# Patient Record
Sex: Male | Born: 1965
Health system: Southern US, Community
[De-identification: ages and names within clinical notes are randomized; demographics above are authoritative.]

## PROBLEM LIST (undated history)

## (undated) DIAGNOSIS — G459 Transient cerebral ischemic attack, unspecified: Secondary | ICD-10-CM

## (undated) DIAGNOSIS — E7800 Pure hypercholesterolemia, unspecified: Secondary | ICD-10-CM

## (undated) DIAGNOSIS — I639 Cerebral infarction, unspecified: Secondary | ICD-10-CM

## (undated) DIAGNOSIS — M539 Dorsopathy, unspecified: Secondary | ICD-10-CM

## (undated) HISTORY — DX: Transient cerebral ischemic attack, unspecified: G45.9

## (undated) HISTORY — DX: Cerebral infarction, unspecified: I63.9

## (undated) HISTORY — DX: Dorsopathy, unspecified: M53.9

## (undated) HISTORY — PX: FINGER SURGERY: SHX640

---

## 2007-12-28 ENCOUNTER — Emergency Department (HOSPITAL_COMMUNITY): Admission: EM | Admit: 2007-12-28 | Discharge: 2007-12-28 | Payer: Self-pay | Admitting: Emergency Medicine

## 2019-02-14 DIAGNOSIS — R109 Unspecified abdominal pain: Secondary | ICD-10-CM | POA: Diagnosis not present

## 2019-02-14 DIAGNOSIS — M5441 Lumbago with sciatica, right side: Secondary | ICD-10-CM | POA: Diagnosis not present

## 2019-02-14 DIAGNOSIS — M62830 Muscle spasm of back: Secondary | ICD-10-CM | POA: Diagnosis not present

## 2019-03-04 DIAGNOSIS — M5441 Lumbago with sciatica, right side: Secondary | ICD-10-CM | POA: Diagnosis not present

## 2019-03-04 DIAGNOSIS — M4726 Other spondylosis with radiculopathy, lumbar region: Secondary | ICD-10-CM | POA: Diagnosis not present

## 2019-03-04 DIAGNOSIS — M5442 Lumbago with sciatica, left side: Secondary | ICD-10-CM | POA: Diagnosis not present

## 2019-03-09 DIAGNOSIS — M5416 Radiculopathy, lumbar region: Secondary | ICD-10-CM | POA: Diagnosis not present

## 2019-03-29 DIAGNOSIS — M5416 Radiculopathy, lumbar region: Secondary | ICD-10-CM | POA: Diagnosis not present

## 2019-03-29 DIAGNOSIS — M5126 Other intervertebral disc displacement, lumbar region: Secondary | ICD-10-CM | POA: Diagnosis not present

## 2019-03-29 DIAGNOSIS — M48061 Spinal stenosis, lumbar region without neurogenic claudication: Secondary | ICD-10-CM | POA: Diagnosis not present

## 2019-06-06 DIAGNOSIS — Z20828 Contact with and (suspected) exposure to other viral communicable diseases: Secondary | ICD-10-CM | POA: Diagnosis not present

## 2019-06-06 DIAGNOSIS — Z03818 Encounter for observation for suspected exposure to other biological agents ruled out: Secondary | ICD-10-CM | POA: Diagnosis not present

## 2019-06-06 DIAGNOSIS — U071 COVID-19: Secondary | ICD-10-CM | POA: Diagnosis not present

## 2019-11-11 ENCOUNTER — Ambulatory Visit: Payer: Self-pay | Admitting: Emergency Medicine

## 2020-01-12 ENCOUNTER — Encounter: Payer: Self-pay | Admitting: Emergency Medicine

## 2020-01-12 ENCOUNTER — Other Ambulatory Visit: Payer: Self-pay

## 2020-01-12 ENCOUNTER — Ambulatory Visit (INDEPENDENT_AMBULATORY_CARE_PROVIDER_SITE_OTHER): Payer: BC Managed Care – PPO | Admitting: Emergency Medicine

## 2020-01-12 VITALS — BP 166/82 | HR 72 | Temp 98.3°F | Resp 16 | Ht 68.75 in | Wt 151.0 lb

## 2020-01-12 DIAGNOSIS — Z7689 Persons encountering health services in other specified circumstances: Secondary | ICD-10-CM

## 2020-01-12 DIAGNOSIS — G459 Transient cerebral ischemic attack, unspecified: Secondary | ICD-10-CM | POA: Diagnosis not present

## 2020-01-12 NOTE — Patient Instructions (Addendum)
Start taking Bayer baby aspirin daily.    If you have lab work done today you will be contacted with your lab results within the next 2 weeks.  If you have not heard from [place] then please contact us. The fastest way to get your results is to register for My Chart.   IF you received an x-ray today, you will receive an invoice from [facility]. Please contact [facility] at [phone] with questions or concerns regarding your invoice.   IF you received labwork today, you will receive an invoice from [facility]. Please contact [facility] at [phone] with questions or concerns regarding your invoice.   Our billing staff will not be able to assist you with questions regarding bills from these companies.  You will be contacted with the lab results as soon as they are available. The fastest way to get your results is to activate your My Chart account. Instructions are located on the last page of this paperwork. If you have not heard from [place] regarding the results in 2 weeks, please contact this office.     Transient Ischemic Attack  A transient ischemic attack (TIA) is a "warning stroke" that causes stroke-like symptoms that go away quickly. A TIA does not cause lasting damage to the brain. But having a TIA is a sign that you may be at risk for a stroke. Lifestyle changes and medical treatments can help prevent a stroke. It is important to know the symptoms of a TIA and what to do. Get help right away, even if your symptoms go away. The symptoms of a TIA are the same as those of a stroke. They can happen fast, and they usually go away within minutes or hours. They can include:  Weakness or loss of feeling in your face, arm, or leg. This often happens on one side of your body.  Trouble walking.  Trouble moving your arms or legs.  Trouble talking or understanding what people are saying.  Trouble seeing.  Seeing two of one object (double vision).  Feeling dizzy.  Feeling  confused.  Loss of balance or coordination.  Feeling sick to your stomach (nauseous) and throwing up (vomiting).  A very bad headache for no reason. What increases the risk? Certain things may make you more likely to have a TIA. Some of these are things that you can change, such as:  Being very overweight (obese).  Using products that contain nicotine or tobacco, such as cigarettes and e-cigarettes.  Taking birth control pills.  Not being active.  Drinking too much alcohol.  Using drugs. Other risk factors include:  Having an irregular heartbeat (atrial fibrillation).  Being African American or Hispanic.  Having had blood clots, stroke, TIA, or heart attack in the past.  Being a woman with a history of high blood pressure in pregnancy (preeclampsia).  Being over the age of 56.  Being male.  Having family history of stroke.  Having the following diseases or conditions: ? High blood pressure. ? High cholesterol. ? Diabetes. ? Heart disease. ? Sickle cell disease. ? Sleep apnea. ? Migraine headache. ? Long-term (chronic) diseases that cause soreness and swelling (inflammation). ? Disorders that affect how your blood clots. Follow these instructions at home: Medicines   Take over-the-counter and prescription medicines only as told by your doctor.  If you were told to take aspirin or another medicine to thin your blood, take it exactly as told by your doctor. ? Taking too much of the medicine can cause  bleeding. ? Taking too little of the medicine may not work to treat the problem. Eating and drinking   Eat 5 or more servings of fruits and vegetables each day.  Follow instructions from your doctor about your diet. You may need to follow a certain diet to help lower your risk of having a stroke. You may need to: ? Eat a diet that is low in fat and salt. ? Eat foods that contain a lot of fiber. ? Limit the amount of carbohydrates and sugar in your  diet.  Limit alcohol intake to 1 drink a day for nonpregnant women and 2 drinks a day for men. One drink equals 12 oz of beer, 5 oz of wine, or 1 oz of hard liquor. General instructions  Keep a healthy weight.  Stay active. Try to get at least 30 minutes of activity on all or most days.  Find out if you have a condition called sleep apnea. Get treatment if needed.  Do not use any products that contain nicotine or tobacco, such as cigarettes and e-cigarettes. If you need help quitting, ask your doctor.  Do not abuse drugs.  Keep all follow-up visits as told by your doctor. This is important. Get help right away if:  You have any signs of stroke. "BE FAST" is an easy way to remember the main warning signs: ? B - Balance. Signs are dizziness, sudden trouble walking, or loss of balance. ? E - Eyes. Signs are trouble seeing or a sudden change in how you see. ? F - Face. Signs are sudden weakness or loss of feeling of the face, or the face or eyelid drooping on one side. ? A - Arms. Signs are weakness or loss of feeling in an arm. This happens suddenly and usually on one side of the body. ? S - Speech. Signs are sudden trouble speaking, slurred speech, or trouble understanding what people say. ? T - Time. Time to call emergency services. Write down what time symptoms started.  You have other signs of stroke, such as: ? A sudden, very bad headache with no known cause. ? Feeling sick to your stomach (nausea). ? Throwing up (vomiting). ? Jerky movements that you cannot control (seizure). These symptoms may be an emergency. Do not wait to see if the symptoms will go away. Get medical help right away. Call your local emergency services (911 in the [place] Do not drive yourself to the hospital. Summary  A transient ischemic attack (TIA) is a "warning stroke" that causes stroke-like symptoms that go away quickly.  A TIA is a medical emergency. Get help right away, even if your symptoms go  away.  A TIA does not cause lasting damage to the brain.  Having a TIA is a sign that you may be at risk for a stroke. Lifestyle changes and medical treatments can help prevent a stroke. This information is not intended to replace advice given to you by your health care provider. Make sure you discuss any questions you have with your health care provider. Document Revised: 11/28/2017 Document Reviewed: 06/05/2016 Elsevier Patient Education  2020 [employer].

## 2020-01-12 NOTE — Progress Notes (Signed)
Robert Johnson 54 y.o.   Chief Complaint  Patient presents with  . Establish Care    per patient wants to check it hypertension, cancer, alzhemier's    HISTORY OF PRESENT ILLNESS: This is a 54 y.o. male here with daughter to establish care with me. Healthy male with a healthy lifestyle and no chronic medical problems on no chronic medications. Yesterday he had an episode that lasted about 5 minutes were his right arm and right leg became numb and weak and was also having trouble getting his words out.  Witnessed by wife at home.  No similar prior episodes.  Asymptomatic today.  HPI   Prior to Admission medications   Not on File    No Known Allergies  There are no problems to display for this patient.   History reviewed. No pertinent past medical history.  History reviewed. No pertinent surgical history.  Social History   Socioeconomic History  . Marital status: Married    Spouse name: Not on file  . Number of children: Not on file  . Years of education: Not on file  . Highest education level: Not on file  Occupational History  . Not on file  Tobacco Use  . Smoking status: Former Smoker    Years: 17.00    Types: Cigarettes  . Smokeless tobacco: Never Used  Substance and Sexual Activity  . Alcohol use: Yes    Comment: 1-2 beers once in a while  . Drug use: Never  . Sexual activity: Not on file  Other Topics Concern  . Not on file  Social History Narrative  . Not on file   Social Determinants of Health   Financial Resource Strain:   . Difficulty of Paying Living Expenses: Not on file  Food Insecurity:   . Worried About Programme researcher, broadcasting/film/video in the Last Year: Not on file  . Ran Out of Food in the Last Year: Not on file  Transportation Needs:   . Lack of Transportation (Medical): Not on file  . Lack of Transportation (Non-Medical): Not on file  Physical Activity:   . Days of Exercise per Week: Not on file  . Minutes of Exercise per Session: Not on file  Stress:    . Feeling of Stress : Not on file  Social Connections:   . Frequency of Communication with Friends and Family: Not on file  . Frequency of Social Gatherings with Friends and Family: Not on file  . Attends Religious Services: Not on file  . Active Member of Clubs or Organizations: Not on file  . Attends Banker Meetings: Not on file  . Marital Status: Not on file  Intimate Partner Violence:   . Fear of Current or Ex-Partner: Not on file  . Emotionally Abused: Not on file  . Physically Abused: Not on file  . Sexually Abused: Not on file    History reviewed. No pertinent family history.   Review of Systems  Constitutional: Negative.  Negative for chills and fever.  HENT: Negative.  Negative for congestion and sore throat.   Respiratory: Negative.  Negative for cough and shortness of breath.   Cardiovascular: Negative.  Negative for chest pain and palpitations.  Gastrointestinal: Negative for abdominal pain, diarrhea, nausea and vomiting.  Musculoskeletal: Negative.   Skin: Negative.  Negative for rash.  Neurological: Positive for dizziness and headaches. Negative for sensory change, speech change, seizures and loss of consciousness.  All other systems reviewed and are negative.  Physical Exam Vitals reviewed.  Constitutional:      Appearance: Normal appearance.  HENT:     Head: Normocephalic.     Mouth/Throat:     Mouth: Mucous membranes are moist.     Pharynx: Oropharynx is clear.  Eyes:     Extraocular Movements: Extraocular movements intact.     Conjunctiva/sclera: Conjunctivae normal.     Pupils: Pupils are equal, round, and reactive to light.  Cardiovascular:     Rate and Rhythm: Normal rate and regular rhythm.     Pulses: Normal pulses.     Heart sounds: Normal heart sounds.  Pulmonary:     Effort: Pulmonary effort is normal.     Breath sounds: Normal breath sounds.  Abdominal:     General: Bowel sounds are normal. There is no distension.      Palpations: Abdomen is soft.     Tenderness: There is no abdominal tenderness.  Musculoskeletal:        General: Normal range of motion.     Cervical back: Normal range of motion and neck supple.  Skin:    General: Skin is warm and dry.     Capillary Refill: Capillary refill takes less than 2 seconds.  Neurological:     General: No focal deficit present.     Mental Status: He is alert and oriented to person, place, and time.  Psychiatric:        Mood and Affect: Mood normal.        Behavior: Behavior normal.    A total of 45 minutes was spent with the patient, greater than 50% of which was in counseling/coordination of care regarding differential diagnosis of his symptoms including possibility of TIA, ED precautions, need to start taking baby aspirin daily, need for urgent referral to neurologist, prognosis, health maintenance items, documentation and need for follow-up after neurology evaluation.   ASSESSMENT & PLAN: Robert Johnson was seen today for establish care.  Diagnoses and all orders for this visit:  Transient ischemic attack (TIA) -     Comprehensive metabolic panel -     Lipid panel -     Hemoglobin A1c -     Ambulatory referral to Neurology -     Vitamin B12 -     CBC with Differential/Platelet  Encounter to establish care    Patient Instructions   Start taking Bayer baby aspirin daily.    If you have lab work done today you will be contacted with your lab results within the next 2 weeks.  If you have not heard from us then please contact us. The fastest way to get your results is to register for My Chart.   IF you received an x-ray today, you will receive an invoice from [facility]. Please contact [facility] at [phone] with questions or concerns regarding your invoice.   IF you received labwork today, you will receive an invoice from [facility]. Please contact [facility] at [phone] with questions or concerns regarding your invoice.   Our  billing staff will not be able to assist you with questions regarding bills from these companies.  You will be contacted with the lab results as soon as they are available. The fastest way to get your results is to activate your My Chart account. Instructions are located on the last page of this paperwork. If you have not heard from us regarding the results in 2 weeks, please contact this office.     Transient Ischemic Attack  A transient ischemic  attack (TIA) is a "warning stroke" that causes stroke-like symptoms that go away quickly. A TIA does not cause lasting damage to the brain. But having a TIA is a sign that you may be at risk for a stroke. Lifestyle changes and medical treatments can help prevent a stroke. It is important to know the symptoms of a TIA and what to do. Get help right away, even if your symptoms go away. The symptoms of a TIA are the same as those of a stroke. They can happen fast, and they usually go away within minutes or hours. They can include:  Weakness or loss of feeling in your face, arm, or leg. This often happens on one side of your body.  Trouble walking.  Trouble moving your arms or legs.  Trouble talking or understanding what people are saying.  Trouble seeing.  Seeing two of one object (double vision).  Feeling dizzy.  Feeling confused.  Loss of balance or coordination.  Feeling sick to your stomach (nauseous) and throwing up (vomiting).  A very bad headache for no reason. What increases the risk? Certain things may make you more likely to have a TIA. Some of these are things that you can change, such as:  Being very overweight (obese).  Using products that contain nicotine or tobacco, such as cigarettes and e-cigarettes.  Taking birth control pills.  Not being active.  Drinking too much alcohol.  Using drugs. Other risk factors include:  Having an irregular heartbeat (atrial fibrillation).  Being African American or  Hispanic.  Having had blood clots, stroke, TIA, or heart attack in the past.  Being a woman with a history of high blood pressure in pregnancy (preeclampsia).  Being over the age of 2.  Being male.  Having family history of stroke.  Having the following diseases or conditions: ? High blood pressure. ? High cholesterol. ? Diabetes. ? Heart disease. ? Sickle cell disease. ? Sleep apnea. ? Migraine headache. ? Long-term (chronic) diseases that cause soreness and swelling (inflammation). ? Disorders that affect how your blood clots. Follow these instructions at home: Medicines   Take over-the-counter and prescription medicines only as told by your doctor.  If you were told to take aspirin or another medicine to thin your blood, take it exactly as told by your doctor. ? Taking too much of the medicine can cause bleeding. ? Taking too little of the medicine may not work to treat the problem. Eating and drinking   Eat 5 or more servings of fruits and vegetables each day.  Follow instructions from your doctor about your diet. You may need to follow a certain diet to help lower your risk of having a stroke. You may need to: ? Eat a diet that is low in fat and salt. ? Eat foods that contain a lot of fiber. ? Limit the amount of carbohydrates and sugar in your diet.  Limit alcohol intake to 1 drink a day for nonpregnant women and 2 drinks a day for men. One drink equals 12 oz of beer, 5 oz of wine, or 1 oz of hard liquor. General instructions  Keep a healthy weight.  Stay active. Try to get at least 30 minutes of activity on all or most days.  Find out if you have a condition called sleep apnea. Get treatment if needed.  Do not use any products that contain nicotine or tobacco, such as cigarettes and e-cigarettes. If you need help quitting, ask your doctor.  Do not abuse  drugs.  Keep all follow-up visits as told by your doctor. This is important. Get help right away  if:  You have any signs of stroke. "BE FAST" is an easy way to remember the main warning signs: ? B - Balance. Signs are dizziness, sudden trouble walking, or loss of balance. ? E - Eyes. Signs are trouble seeing or a sudden change in how you see. ? F - Face. Signs are sudden weakness or loss of feeling of the face, or the face or eyelid drooping on one side. ? A - Arms. Signs are weakness or loss of feeling in an arm. This happens suddenly and usually on one side of the body. ? S - Speech. Signs are sudden trouble speaking, slurred speech, or trouble understanding what people say. ? T - Time. Time to call emergency services. Write down what time symptoms started.  You have other signs of stroke, such as: ? A sudden, very bad headache with no known cause. ? Feeling sick to your stomach (nausea). ? Throwing up (vomiting). ? Jerky movements that you cannot control (seizure). These symptoms may be an emergency. Do not wait to see if the symptoms will go away. Get medical help right away. Call your local emergency services (911 in the [place] Do not drive yourself to the hospital. Summary  A transient ischemic attack (TIA) is a "warning stroke" that causes stroke-like symptoms that go away quickly.  A TIA is a medical emergency. Get help right away, even if your symptoms go away.  A TIA does not cause lasting damage to the brain.  Having a TIA is a sign that you may be at risk for a stroke. Lifestyle changes and medical treatments can help prevent a stroke. This information is not intended to replace advice given to you by your health care provider. Make sure you discuss any questions you have with your health care provider. Document Revised: 11/28/2017 Document Reviewed: 06/05/2016 Elsevier Patient Education  2020 [employer]      Edwina Barth, MD [facility]

## 2020-01-13 ENCOUNTER — Other Ambulatory Visit: Payer: Self-pay | Admitting: Emergency Medicine

## 2020-01-13 DIAGNOSIS — E785 Hyperlipidemia, unspecified: Secondary | ICD-10-CM

## 2020-01-13 LAB — COMPREHENSIVE METABOLIC PANEL
ALT: 22 IU/L (ref 0–44)
AST: 16 IU/L (ref 0–40)
Albumin/Globulin Ratio: 1.6 (ref 1.2–2.2)
Albumin: 4.5 g/dL (ref 3.8–4.9)
Alkaline Phosphatase: 76 IU/L (ref 44–121)
BUN/Creatinine Ratio: 11 (ref 9–20)
BUN: 9 mg/dL (ref 6–24)
Bilirubin Total: 0.6 mg/dL (ref 0.0–1.2)
CO2: 22 mmol/L (ref 20–29)
Calcium: 9.6 mg/dL (ref 8.7–10.2)
Chloride: 108 mmol/L — ABNORMAL HIGH (ref 96–106)
Creatinine, Ser: 0.83 mg/dL (ref 0.76–1.27)
GFR calc Af Amer: 117 mL/min/{1.73_m2} (ref >59)
GFR calc non Af Amer: 101 mL/min/{1.73_m2} (ref >59)
Globulin, Total: 2.8 g/dL (ref 1.5–4.5)
Glucose: 93 mg/dL (ref 65–99)
Potassium: 3.9 mmol/L (ref 3.5–5.2)
Sodium: 145 mmol/L — ABNORMAL HIGH (ref 134–144)
Total Protein: 7.3 g/dL (ref 6.0–8.5)

## 2020-01-13 LAB — CBC WITH DIFFERENTIAL/PLATELET
Basophils Absolute: 0.1 10*3/uL (ref 0.0–0.2)
Basos: 1 %
EOS (ABSOLUTE): 1.1 10*3/uL — ABNORMAL HIGH (ref 0.0–0.4)
Eos: 15 %
Hematocrit: 43.6 % (ref 37.5–51.0)
Hemoglobin: 13.6 g/dL (ref 13.0–17.7)
Immature Grans (Abs): 0 10*3/uL (ref 0.0–0.1)
Immature Granulocytes: 0 %
Lymphocytes Absolute: 1.7 10*3/uL (ref 0.7–3.1)
Lymphs: 24 %
MCH: 23.4 pg — ABNORMAL LOW (ref 26.6–33.0)
MCHC: 31.2 g/dL — ABNORMAL LOW (ref 31.5–35.7)
MCV: 75 fL — ABNORMAL LOW (ref 79–97)
Monocytes Absolute: 0.4 10*3/uL (ref 0.1–0.9)
Monocytes: 6 %
Neutrophils Absolute: 3.9 10*3/uL (ref 1.4–7.0)
Neutrophils: 54 %
Platelets: 244 10*3/uL (ref 150–450)
RBC: 5.82 x10E6/uL — ABNORMAL HIGH (ref 4.14–5.80)
RDW: 14.9 % (ref 11.6–15.4)
WBC: 7.1 10*3/uL (ref 3.4–10.8)

## 2020-01-13 LAB — LIPID PANEL
Chol/HDL Ratio: 5.2 ratio — ABNORMAL HIGH (ref 0.0–5.0)
Cholesterol, Total: 263 mg/dL — ABNORMAL HIGH (ref 100–199)
HDL: 51 mg/dL (ref >39)
LDL Chol Calc (NIH): 170 mg/dL — ABNORMAL HIGH (ref 0–99)
Triglycerides: 224 mg/dL — ABNORMAL HIGH (ref 0–149)
VLDL Cholesterol Cal: 42 mg/dL — ABNORMAL HIGH (ref 5–40)

## 2020-01-13 LAB — HEMOGLOBIN A1C
Est. average glucose Bld gHb Est-mCnc: 85 mg/dL
Hgb A1c MFr Bld: 4.6 % — ABNORMAL LOW (ref 4.8–5.6)

## 2020-01-13 LAB — VITAMIN B12: Vitamin B-12: 452 pg/mL (ref 232–1245)

## 2020-01-13 MED ORDER — ROSUVASTATIN CALCIUM 20 MG PO TABS: 20.0000 mg | ORAL_TABLET | Freq: Every day | ORAL | 3 refills | Status: DC

## 2020-01-17 ENCOUNTER — Telehealth: Payer: Self-pay | Admitting: Diagnostic Neuroimaging

## 2020-01-17 ENCOUNTER — Ambulatory Visit (INDEPENDENT_AMBULATORY_CARE_PROVIDER_SITE_OTHER): Payer: BC Managed Care – PPO | Admitting: Diagnostic Neuroimaging

## 2020-01-17 ENCOUNTER — Other Ambulatory Visit: Payer: Self-pay

## 2020-01-17 ENCOUNTER — Encounter: Payer: Self-pay | Admitting: Diagnostic Neuroimaging

## 2020-01-17 VITALS — BP 122/67 | HR 70 | Ht 67.0 in | Wt 153.4 lb

## 2020-01-17 DIAGNOSIS — G459 Transient cerebral ischemic attack, unspecified: Secondary | ICD-10-CM

## 2020-01-17 NOTE — Patient Instructions (Signed)
  TIA (right arm and leg numbness; aphasia) - MRI brain, carotid u/s, echocardiogram - continue aspirin 81mg , crestor

## 2020-01-17 NOTE — Progress Notes (Signed)
[facility]  PATIENT: Robert Johnson DOB: [birth year]  REFERRING CLINICIAN: Georgina Quint, * HISTORY FROM: patient  REASON FOR VISIT: new consult    HISTORICAL  CHIEF COMPLAINT:  Chief Complaint  Patient presents with   Possible TIA    rm 7 New Pt dgtr- Joni Reining "mom witnessed patient unable to move right side of his body, couldn't speak; still nauseous with constant headache; saw new PCP the very next day"    HISTORY OF PRESENT ILLNESS:    54 year old male here for evaluation of transient right-sided numbness and weakness.  01/11/2020 at 1:00 morning patient had 1 to 2 minutes of right face, right arm, right leg weakness, numbness and difficulty talking.  Patient has been having some headaches and "fevers" for several months leading up to this.  Patient continues to have daily headache and nausea.  Between 6 and 9:00 in the morning he has a hot feeling internally.  No night sweats.  No abnormal temperature readings.   REVIEW OF SYSTEMS: Full 14 system review of systems performed and negative with exception of: As per HPI.  ALLERGIES: No Known Allergies  HOME MEDICATIONS: Outpatient Medications Prior to Visit  Medication Sig Dispense Refill   acetaminophen (TYLENOL) 500 MG tablet Take 500 mg by mouth every 6 (six) hours as needed.     aspirin EC 81 MG tablet Take 81 mg by mouth daily. Swallow whole.     Aspirin-Acetaminophen (GOODYS BODY PAIN PO) Take by mouth.     rosuvastatin (CRESTOR) 20 MG tablet Take 1 tablet (20 mg total) by mouth daily. (Patient not taking: Reported on 01/17/2020) 90 tablet 3   No facility-administered medications prior to visit.    PAST MEDICAL HISTORY: Past Medical History:  Diagnosis Date   Back problem    Mini stroke (HCC)    10/26    PAST SURGICAL HISTORY: Past Surgical History:  Procedure Laterality Date   FINGER SURGERY Left 2007 or 2008   index    FAMILY HISTORY: Family History  Problem Relation Age of  Onset   Mental illness Mother    Hypertension Father     SOCIAL HISTORY: Social History   Socioeconomic History   Marital status: Married    Spouse name: Not on file   Number of children: 4   Years of education: Not on file   Highest education level: Not on file  Occupational History   Not on file  Tobacco Use   Smoking status: Former Smoker    Years: 17.00    Types: Cigarettes    Quit date: 01/17/2004    Years since quitting: 16.0   Smokeless tobacco: Never Used  Substance and Sexual Activity   Alcohol use: Yes    Comment: 1-2 beers once in a while   Drug use: Never   Sexual activity: Not on file  Other Topics Concern   Not on file  Social History Narrative   Lives with wife, family   Social Determinants of Health   Financial Resource Strain:    Difficulty of Paying Living Expenses: Not on file  Food Insecurity:    Worried About Programme researcher, broadcasting/film/video in the Last Year: Not on file   [employer] of Food in the Last Year: Not on file  Transportation Needs:    Lack of Transportation (Medical): Not on file   Lack of Transportation (Non-Medical): Not on file  Physical Activity:    Days of Exercise per Week: Not on file  Minutes of Exercise per Session: Not on file  Stress:    Feeling of Stress : Not on file  Social Connections:    Frequency of Communication with Friends and Family: Not on file   Frequency of Social Gatherings with Friends and Family: Not on file   Attends Religious Services: Not on file   Active Member of Clubs or Organizations: Not on file   Attends Banker Meetings: Not on file   Marital Status: Not on file  Intimate Partner Violence:    Fear of Current or Ex-Partner: Not on file   Emotionally Abused: Not on file   Physically Abused: Not on file   Sexually Abused: Not on file     PHYSICAL EXAM  GENERAL EXAM/CONSTITUTIONAL: Vitals:  Vitals:   01/17/20 0941  BP: 122/67  Pulse: 70  Weight: 153 lb  6.4 oz (69.6 kg)  Height: 5\' 7"  (1.702 m)     Body mass index is 24.03 kg/m. Wt Readings from Last 3 Encounters:  01/17/20 153 lb 6.4 oz (69.6 kg)  01/12/20 151 lb (68.5 kg)     Patient is in no distress; well developed, nourished and groomed; neck is supple  CARDIOVASCULAR:  Examination of carotid arteries is normal; no carotid bruits  Regular rate and rhythm, no murmurs  Examination of peripheral vascular system by observation and palpation is normal  EYES:  Ophthalmoscopic exam of optic discs and posterior segments is normal; no papilledema or hemorrhages  No exam data present  MUSCULOSKELETAL:  Gait, strength, tone, movements noted in Neurologic exam below  NEUROLOGIC: MENTAL STATUS:  No flowsheet data found.  awake, alert, oriented to person, place and time  recent and remote memory intact  normal attention and concentration  language fluent, comprehension intact, naming intact  fund of knowledge appropriate  CRANIAL NERVE:   2nd - no papilledema on fundoscopic exam  2nd, 3rd, 4th, 6th - pupils equal and reactive to light, visual fields full to confrontation, extraocular muscles intact, no nystagmus  5th - facial sensation symmetric  7th - facial strength symmetric  8th - hearing intact  9th - palate elevates symmetrically, uvula midline  11th - shoulder shrug symmetric  12th - tongue protrusion midline  MOTOR:   normal bulk and tone, full strength in the BUE, BLE  SENSORY:   normal and symmetric to light touch, temperature, vibration  COORDINATION:   finger-nose-finger, fine finger movements normal  REFLEXES:   deep tendon reflexes present and symmetric  GAIT/STATION:   narrow based gait    DIAGNOSTIC DATA (LABS, IMAGING, TESTING) - I reviewed patient records, labs, notes, testing and imaging myself where available.  Lab Results  Component Value Date   WBC 7.1 01/12/2020   HGB 13.6 01/12/2020   HCT 43.6 01/12/2020    MCV 75 (L) 01/12/2020   PLT 244 01/12/2020      Component Value Date/Time   NA 145 (H) 01/12/2020 1632   K 3.9 01/12/2020 1632   CL 108 (H) 01/12/2020 1632   CO2 22 01/12/2020 1632   GLUCOSE 93 01/12/2020 1632   BUN 9 01/12/2020 1632   CREATININE 0.83 01/12/2020 1632   CALCIUM 9.6 01/12/2020 1632   PROT 7.3 01/12/2020 1632   ALBUMIN 4.5 01/12/2020 1632   AST 16 01/12/2020 1632   ALT 22 01/12/2020 1632   ALKPHOS 76 01/12/2020 1632   BILITOT 0.6 01/12/2020 1632   GFRNONAA 101 01/12/2020 1632   GFRAA 117 01/12/2020 1632   Lab Results  Component Value Date   CHOL 263 (H) 01/12/2020   HDL 51 01/12/2020   LDLCALC 170 (H) 01/12/2020   TRIG 224 (H) 01/12/2020   CHOLHDL 5.2 (H) 01/12/2020   Lab Results  Component Value Date   HGBA1C 4.6 (L) 01/12/2020   Lab Results  Component Value Date   VITAMINB12 452 01/12/2020   No results found for: TSH    ASSESSMENT AND PLAN  54 y.o. year old male here with transient right face arm leg numbness and weakness, transient aphasia on 01/11/2020.  We will proceed with TIA work-up.  Dx:  1. TIA (transient ischemic attack)      PLAN:  TIA (right arm and leg numbness; aphasia) - MRI brain, carotid u/s, TTE - continue aspirin 81mg , crestor  Orders Placed This Encounter  Procedures   MR BRAIN WO CONTRAST   ECHOCARDIOGRAM COMPLETE BUBBLE STUDY   VAS CAROTID   Return pending testing, for pending if symptoms worsen or fail to improve.    [place], MD 01/17/2020, 9:58 AM Certified in Neurology, Neurophysiology and Neuroimaging  [facility] [address] [place], [place] [phone]

## 2020-01-17 NOTE — Telephone Encounter (Signed)
bcbs Berkley Harvey: [id number] (exp. 01/17/20 to 07/14/20) order sent to GI. They will reach out to the patient to schedule.

## 2020-01-21 ENCOUNTER — Ambulatory Visit (HOSPITAL_COMMUNITY)
Admission: RE | Admit: 2020-01-21 | Discharge: 2020-01-21 | Disposition: A | Discharge status: Home / Self Care | Payer: BC Managed Care – PPO | Source: Ambulatory Visit | Attending: Diagnostic Neuroimaging | Admitting: Diagnostic Neuroimaging

## 2020-01-21 ENCOUNTER — Other Ambulatory Visit: Payer: Self-pay

## 2020-01-21 DIAGNOSIS — G459 Transient cerebral ischemic attack, unspecified: Secondary | ICD-10-CM | POA: Diagnosis not present

## 2020-01-25 ENCOUNTER — Telehealth: Payer: Self-pay | Admitting: *Deleted

## 2020-01-25 NOTE — Telephone Encounter (Signed)
Called daughter, Joni Reining on [place] and advised the carotid US study was unremarkable, good results. HIs MRI is on 01/30/20; advised Ill call her with those results when available. She  verbalized understanding, appreciation.

## 2020-01-30 ENCOUNTER — Ambulatory Visit
Admission: RE | Admit: 2020-01-30 | Discharge: 2020-01-30 | Disposition: A | Discharge status: Home / Self Care | Payer: BC Managed Care – PPO | Source: Ambulatory Visit | Attending: Diagnostic Neuroimaging | Admitting: Diagnostic Neuroimaging

## 2020-01-30 ENCOUNTER — Other Ambulatory Visit: Payer: Self-pay

## 2020-01-30 DIAGNOSIS — G459 Transient cerebral ischemic attack, unspecified: Secondary | ICD-10-CM

## 2020-01-31 ENCOUNTER — Telehealth: Payer: Self-pay | Admitting: *Deleted

## 2020-01-31 NOTE — Telephone Encounter (Signed)
Spoke with daughter, Joni Reining on [place] and informed her the MRI brain results are unremarkable imaging results. She stated she has not gotten a call to schedule the echocardiogram for her father. I advised will send this note to Annabelle Harman to check on it and call her. Joni Reining verbalized understanding, appreciation.

## 2020-02-01 NOTE — Telephone Encounter (Signed)
Patient did not Require auth . For Echo. [id number]. Spoke to [employer].  Spoke to patient's daughter.

## 2020-02-14 ENCOUNTER — Ambulatory Visit (HOSPITAL_COMMUNITY)
Admission: RE | Admit: 2020-02-14 | Discharge: 2020-02-14 | Disposition: A | Discharge status: Home / Self Care | Payer: BC Managed Care – PPO | Source: Ambulatory Visit | Attending: Diagnostic Neuroimaging | Admitting: Diagnostic Neuroimaging

## 2020-02-14 ENCOUNTER — Other Ambulatory Visit: Payer: Self-pay

## 2020-02-14 DIAGNOSIS — G459 Transient cerebral ischemic attack, unspecified: Secondary | ICD-10-CM | POA: Diagnosis not present

## 2020-02-14 DIAGNOSIS — R531 Weakness: Secondary | ICD-10-CM | POA: Diagnosis not present

## 2020-02-14 LAB — ECHOCARDIOGRAM COMPLETE BUBBLE STUDY
Area-P 1/2: 4.63 cm2
S' Lateral: 3.3 cm

## 2020-02-14 NOTE — Progress Notes (Signed)
  Echocardiogram 2D Echocardiogram  With bubble has been performed.  Leta Jungling M 02/14/2020, 2:29 PM

## 2020-05-15 ENCOUNTER — Telehealth: Payer: Self-pay | Admitting: *Deleted

## 2020-05-15 NOTE — Telephone Encounter (Signed)
LVM for daughter informing her the echocardiogram showed unremarkable study, no major findings. Left # for questions.

## 2020-12-15 ENCOUNTER — Other Ambulatory Visit: Payer: Self-pay | Admitting: Emergency Medicine

## 2020-12-15 DIAGNOSIS — E785 Hyperlipidemia, unspecified: Secondary | ICD-10-CM

## 2022-01-25 ENCOUNTER — Other Ambulatory Visit: Payer: Self-pay | Admitting: Emergency Medicine

## 2022-01-25 DIAGNOSIS — E785 Hyperlipidemia, unspecified: Secondary | ICD-10-CM

## 2022-03-08 ENCOUNTER — Ambulatory Visit
Admission: EM | Admit: 2022-03-08 | Discharge: 2022-03-08 | Discharge status: Absent without leave | Payer: BC Managed Care – PPO

## 2022-08-16 ENCOUNTER — Ambulatory Visit (HOSPITAL_COMMUNITY): Payer: Self-pay

## 2022-08-19 ENCOUNTER — Ambulatory Visit (HOSPITAL_COMMUNITY)
Admission: RE | Admit: 2022-08-19 | Discharge: 2022-08-19 | Disposition: A | Discharge status: Home / Self Care | Payer: Self-pay | Source: Ambulatory Visit | Attending: Internal Medicine | Admitting: Internal Medicine

## 2022-08-19 ENCOUNTER — Encounter (HOSPITAL_COMMUNITY): Payer: Self-pay

## 2022-08-19 VITALS — BP 151/89 | HR 77 | Temp 99.9°F | Resp 16

## 2022-08-19 DIAGNOSIS — R3912 Poor urinary stream: Secondary | ICD-10-CM

## 2022-08-19 DIAGNOSIS — N410 Acute prostatitis: Secondary | ICD-10-CM

## 2022-08-19 HISTORY — DX: Pure hypercholesterolemia, unspecified: E78.00

## 2022-08-19 LAB — POCT URINALYSIS DIP (MANUAL ENTRY)
Bilirubin, UA: NEGATIVE
Glucose, UA: NEGATIVE mg/dL
Ketones, POC UA: NEGATIVE mg/dL
Leukocytes, UA: NEGATIVE
Nitrite, UA: NEGATIVE
Protein Ur, POC: NEGATIVE mg/dL
Spec Grav, UA: 1.02 (ref 1.010–1.025)
Urobilinogen, UA: 2 E.U./dL — AB
pH, UA: 6.5 (ref 5.0–8.0)

## 2022-08-19 MED ORDER — TAMSULOSIN HCL 0.4 MG PO CAPS: 0.4000 mg | ORAL_CAPSULE | Freq: Every day | ORAL | 0 refills | Status: AC

## 2022-08-19 MED ORDER — CIPROFLOXACIN HCL 500 MG PO TABS: 500.0000 mg | ORAL_TABLET | Freq: Two times a day (BID) | ORAL | 0 refills | Status: AC

## 2022-08-19 NOTE — ED Triage Notes (Signed)
Pt having urinary frequency at night for 2 weeks. Having to strain to urinate.  C/o intermittent lower abd pains that has been 2 weeks as well. Taking IBU for pain. Denies n/v or bowel problems.  Not currently taking any medications daily for anything.

## 2022-08-19 NOTE — ED Provider Notes (Signed)
[facility]    CSN: [id number] Arrival date & time: 08/19/22  1716      History   Chief Complaint Chief Complaint  Patient presents with   appt 530    HPI Robert Johnson is a 57 y.o. male.   Patient presents to urgent care for evaluation of urinary frequency, urinary urgency, dysuria, and weakened urinary stream with associated incomplete bladder emptying for the last 2 weeks.  He states that overnight, he has had to get up to void approximately 9-10 times per night over the last 2 weeks.  States that when he urinates, his urinary stream is very weak and dribbling.  States he voids very small amounts at a time and has significant urge to urinate.  Reports associated bilateral lower abdominal discomfort with urinary symptoms.  Denies gross hematuria, dizziness, low back pain, nausea, vomiting, diarrhea, low back pain, flank pain, blood/mucus in the stools, pain with defecation, constipation, and upper abdominal discomfort.  No recent antibiotic/steroid use.  No history of type 2 diabetes or immunosuppression.  Denies frequent intake of urinary irritants.  States he drinks approximately 1 cup of coffee per day but mostly drinks water.  Denies history of prostate problems in the past.  No recent antibiotic/steroid use.  No history of allergies to medications.  Reports chills at home without known documented fever.  He has been taking Tylenol without relief of discomfort/urinary symptoms.  Last dose of Tylenol was several hours ago.  Currently with low-grade fever at 99.9.  The history is provided by the patient and a relative. A language interpreter was used (In person language interpreter used for entirety of patient care encounter.).    Past Medical History:  Diagnosis Date   Back problem    High cholesterol    Mini stroke    10/26    There are no problems to display for this patient.   Past Surgical History:  Procedure Laterality Date   FINGER SURGERY Left 2007 or 2008    index       Home Medications    Prior to Admission medications   Medication Sig Start Date End Date Taking? Authorizing Provider  ciprofloxacin (CIPRO) 500 MG tablet Take 1 tablet (500 mg total) by mouth every 12 (twelve) hours for 7 days. 08/19/22 08/26/22 Yes Carlisle Beers, FNP  tamsulosin (FLOMAX) 0.4 MG CAPS capsule Take 1 capsule (0.4 mg total) by mouth daily. 08/19/22  Yes Carlisle Beers, FNP  acetaminophen (TYLENOL) 500 MG tablet Take 500 mg by mouth every 6 (six) hours as needed.    [provider]  aspirin EC 81 MG tablet Take 81 mg by mouth daily. Swallow whole.    [provider]  Aspirin-Acetaminophen (GOODYS BODY PAIN PO) Take by mouth.    [provider]  rosuvastatin (CRESTOR) 20 MG tablet TAKE 1 TABLET(20 MG) BY MOUTH DAILY 01/25/22   Georgina Quint, MD    Family History Family History  Problem Relation Age of Onset   Mental illness Mother    Hypertension Father     Social History Social History   Tobacco Use   Smoking status: Former    Years: 17    Types: Cigarettes    Quit date: 01/17/2004    Years since quitting: 18.6   Smokeless tobacco: Never  Substance Use Topics   Alcohol use: Yes    Comment: 1-2 beers once in a while   Drug use: Never     Allergies  Patient has no known allergies.   Review of Systems Review of Systems Per HPI  Physical Exam Triage Vital Signs ED Triage Vitals  Enc Vitals Group     BP 08/19/22 1750 (!) 151/89     Pulse Rate 08/19/22 1750 77     Resp 08/19/22 1750 16     Temp 08/19/22 1750 99.9 F (37.7 C)     Temp src --      SpO2 08/19/22 1750 96 %     Weight --      Height --      Head Circumference --      Peak Flow --      Pain Score 08/19/22 1748 3     Pain Loc --      Pain Edu? --      Excl. in GC? --    No data found.  Updated Vital Signs BP (!) 151/89 (BP Location: Right Arm)   Pulse 77   Temp 99.9 F (37.7 C)   Resp 16   SpO2 96%   Visual  Acuity Right Eye Distance:   Left Eye Distance:   Bilateral Distance:    Right Eye Near:   Left Eye Near:    Bilateral Near:     Physical Exam Vitals and nursing note reviewed.  Constitutional:      Appearance: He is not ill-appearing or toxic-appearing.  HENT:     Head: Normocephalic and atraumatic.     Right Ear: Hearing and external ear normal.     Left Ear: Hearing and external ear normal.     Nose: Nose normal.     Mouth/Throat:     Lips: Pink.  Eyes:     General: Lids are normal. Vision grossly intact. Gaze aligned appropriately.     Extraocular Movements: Extraocular movements intact.     Conjunctiva/sclera: Conjunctivae normal.  Cardiovascular:     Rate and Rhythm: Normal rate and regular rhythm.     Heart sounds: Normal heart sounds, S1 normal and S2 normal.  Pulmonary:     Effort: Pulmonary effort is normal. No respiratory distress.     Breath sounds: Normal breath sounds and air entry.  Abdominal:     General: Bowel sounds are normal.     Palpations: Abdomen is soft.     Tenderness: There is abdominal tenderness in the right lower quadrant, suprapubic area and left lower quadrant. There is no right CVA tenderness, left CVA tenderness or guarding.     Comments: No peritoneal signs to abdominal exam.  Musculoskeletal:     Cervical back: Neck supple.  Skin:    General: Skin is warm and dry.     Capillary Refill: Capillary refill takes less than 2 seconds.     Findings: No rash.  Neurological:     General: No focal deficit present.     Mental Status: He is alert and oriented to person, place, and time. Mental status is at baseline.     Cranial Nerves: No dysarthria or facial asymmetry.  Psychiatric:        Mood and Affect: Mood normal.        Speech: Speech normal.        Behavior: Behavior normal.        Thought Content: Thought content normal.        Judgment: Judgment normal.      UC Treatments / Results  Labs (all labs ordered are listed, but only  abnormal results are displayed) Labs  Reviewed  POCT URINALYSIS DIP (MANUAL ENTRY) - Abnormal; Notable for the following components:      Result Value   Blood, UA trace-intact (*)    Urobilinogen, UA 2.0 (*)    All other components within normal limits  URINE CULTURE    EKG   Radiology No results found.  Procedures Procedures (including critical care time)  Medications Ordered in UC Medications - No data to display  Initial Impression / Assessment and Plan / UC Course  I have reviewed the triage vital signs and the nursing notes.  Pertinent labs & imaging results that were available during my care of the patient were reviewed by me and considered in my medical decision making (see chart for details).   1.  Acute prostatitis, weak urinary stream Presentation is concerning for acute bacterial prostatitis given symptoms and history provided. Low suspicion for pyelonephritis/nephrolithiasis etiology. Urinalysis shows trace blood. Although no leukocytes were shown to the urine, I would like to culture the urine given patient's significant urinary symptoms. Will change treatment plan if necessary based on urine culture. Currently with low grade fever at 99.9 in clinic, encouraged to take tylenol when he gets home. Reviewed most recent labs from 2021 showing normal kidney and liver function. I am unable to see a PSA level, however encouraged patient to schedule a follow-up appointment with PCP to evaluate for BPH and for PSA screening.   Ciprofloxacin BID for 7 days sent to pharmacy to be taken with food to avoid stomach upset. May use Flomax tablet once daily for the next 14 days to provide further symptomatic relief. Continue tylenol as needed for fever, chills, and aches/pains. PCP follow-up recommended as stated above.   Discussed physical exam and available lab work findings in clinic with patient.  Counseled patient regarding appropriate use of medications and potential side effects for  all medications recommended or prescribed today. Discussed red flag signs and symptoms of worsening condition,when to call the PCP office, return to urgent care, and when to seek higher level of care in the emergency department. Patient verbalizes understanding and agreement with plan. All questions answered. Patient discharged in stable condition.    Final Clinical Impressions(s) / UC Diagnoses   Final diagnoses:  Acute prostatitis  Weak urinary stream     Discharge Instructions      Your symptoms are likely due to acute prostatitis.   Take ciprofloxacin antibiotic twice daily for the next 5 days.  Take Flomax tablet once daily to improve your urinary urgency and frequency symptoms for the next 2 weeks or until symptoms improve.  Please schedule an appointment with your primary care provider as soon as possible for re-check.  If you develop any new or worsening symptoms or do not improve in the next 2 to 3 days, please return.  If your symptoms are severe, please go to the emergency room.  Follow-up with your primary care provider for further evaluation and management of your symptoms as well as ongoing wellness visits.  I hope you feel better!       ED Prescriptions     Medication Sig Dispense Auth. Provider   ciprofloxacin (CIPRO) 500 MG tablet Take 1 tablet (500 mg total) by mouth every 12 (twelve) hours for 7 days. 14 tablet Carlisle Beers, FNP   tamsulosin (FLOMAX) 0.4 MG CAPS capsule Take 1 capsule (0.4 mg total) by mouth daily. 14 capsule Carlisle Beers, FNP      PDMP not reviewed this encounter.  Reita May St[place], [place] 08/20/22 [phone]

## 2022-08-19 NOTE — Discharge Instructions (Addendum)
Your symptoms are likely due to acute prostatitis.   Take ciprofloxacin antibiotic twice daily for the next 5 days.  Take Flomax tablet once daily to improve your urinary urgency and frequency symptoms for the next 2 weeks or until symptoms improve.  Please schedule an appointment with your primary care provider as soon as possible for re-check.  If you develop any new or worsening symptoms or do not improve in the next 2 to 3 days, please return.  If your symptoms are severe, please go to the emergency room.  Follow-up with your primary care provider for further evaluation and management of your symptoms as well as ongoing wellness visits.  I hope you feel better!

## 2022-08-20 LAB — URINE CULTURE: Culture: NO GROWTH

## 2023-01-23 ENCOUNTER — Ambulatory Visit: Payer: Self-pay | Admitting: Emergency Medicine

## 2023-05-06 ENCOUNTER — Ambulatory Visit (INDEPENDENT_AMBULATORY_CARE_PROVIDER_SITE_OTHER): Payer: Self-pay | Admitting: Emergency Medicine

## 2023-05-06 ENCOUNTER — Encounter: Payer: Self-pay | Admitting: Emergency Medicine

## 2023-05-06 ENCOUNTER — Ambulatory Visit (INDEPENDENT_AMBULATORY_CARE_PROVIDER_SITE_OTHER): Payer: Self-pay

## 2023-05-06 VITALS — BP 144/80 | HR 87 | Temp 98.3°F | Ht 67.0 in | Wt 153.6 lb

## 2023-05-06 DIAGNOSIS — R011 Cardiac murmur, unspecified: Secondary | ICD-10-CM

## 2023-05-06 DIAGNOSIS — I10 Essential (primary) hypertension: Secondary | ICD-10-CM

## 2023-05-06 DIAGNOSIS — Z7689 Persons encountering health services in other specified circumstances: Secondary | ICD-10-CM

## 2023-05-06 DIAGNOSIS — Z8673 Personal history of transient ischemic attack (TIA), and cerebral infarction without residual deficits: Secondary | ICD-10-CM | POA: Insufficient documentation

## 2023-05-06 DIAGNOSIS — E785 Hyperlipidemia, unspecified: Secondary | ICD-10-CM

## 2023-05-06 LAB — LIPID PANEL
Cholesterol: 226 mg/dL — ABNORMAL HIGH (ref 0–200)
HDL: 47.4 mg/dL (ref >39.00)
LDL Cholesterol: 121 mg/dL — ABNORMAL HIGH (ref 0–99)
NonHDL: 178.34
Total CHOL/HDL Ratio: 5
Triglycerides: 285 mg/dL — ABNORMAL HIGH (ref 0.0–149.0)
VLDL: 57 mg/dL — ABNORMAL HIGH (ref 0.0–40.0)

## 2023-05-06 LAB — COMPREHENSIVE METABOLIC PANEL
ALT: 35 U/L (ref 0–53)
AST: 23 U/L (ref 0–37)
Albumin: 4.8 g/dL (ref 3.5–5.2)
Alkaline Phosphatase: 82 U/L (ref 39–117)
BUN: 11 mg/dL (ref 6–23)
CO2: 29 meq/L (ref 19–32)
Calcium: 9 mg/dL (ref 8.4–10.5)
Chloride: 104 meq/L (ref 96–112)
Creatinine, Ser: 0.88 mg/dL (ref 0.40–1.50)
GFR: 96.38 mL/min (ref >60.00)
Glucose, Bld: 107 mg/dL — ABNORMAL HIGH (ref 70–99)
Potassium: 3.9 meq/L (ref 3.5–5.1)
Sodium: 140 meq/L (ref 135–145)
Total Bilirubin: 1.1 mg/dL (ref 0.2–1.2)
Total Protein: 7.6 g/dL (ref 6.0–8.3)

## 2023-05-06 LAB — CBC WITH DIFFERENTIAL/PLATELET
Basophils Absolute: 0.1 10*3/uL (ref 0.0–0.1)
Basophils Relative: 1 % (ref 0.0–3.0)
Eosinophils Absolute: 0.8 10*3/uL — ABNORMAL HIGH (ref 0.0–0.7)
Eosinophils Relative: 11.7 % — ABNORMAL HIGH (ref 0.0–5.0)
HCT: 43.3 % (ref 39.0–52.0)
Hemoglobin: 13.4 g/dL (ref 13.0–17.0)
Lymphocytes Relative: 24.8 % (ref 12.0–46.0)
Lymphs Abs: 1.7 10*3/uL (ref 0.7–4.0)
MCHC: 30.8 g/dL (ref 30.0–36.0)
MCV: 76 fL — ABNORMAL LOW (ref 78.0–100.0)
Monocytes Absolute: 0.4 10*3/uL (ref 0.1–1.0)
Monocytes Relative: 5.8 % (ref 3.0–12.0)
Neutro Abs: 3.8 10*3/uL (ref 1.4–7.7)
Neutrophils Relative %: 56.7 % (ref 43.0–77.0)
Platelets: 201 10*3/uL (ref 150.0–400.0)
RBC: 5.69 Mil/uL (ref 4.22–5.81)
RDW: 14.1 % (ref 11.5–15.5)
WBC: 6.7 10*3/uL (ref 4.0–10.5)

## 2023-05-06 LAB — HEMOGLOBIN A1C: Hgb A1c MFr Bld: 4.7 % (ref 4.6–6.5)

## 2023-05-06 MED ORDER — ROSUVASTATIN CALCIUM 20 MG PO TABS: 20.0000 mg | ORAL_TABLET | Freq: Every day | ORAL | 3 refills | Status: DC

## 2023-05-06 MED ORDER — LISINOPRIL 20 MG PO TABS: 20.0000 mg | ORAL_TABLET | Freq: Every day | ORAL | 3 refills | Status: DC

## 2023-05-06 NOTE — Assessment & Plan Note (Signed)
 BP Readings from Last 3 Encounters:  05/06/23 (!) 144/80  08/19/22 (!) 151/89  01/17/20 122/67  Recommend to start lisinopril 20 mg daily Blood work done today Cardiovascular risks associated with hypertension discussed

## 2023-05-06 NOTE — Patient Instructions (Signed)
 Health Maintenance, Male  Adopting a healthy lifestyle and getting preventive care are important in promoting health and wellness. Ask your health care provider about:  The right schedule for you to have regular tests and exams.  Things you can do on your own to prevent diseases and keep yourself healthy.  What should I know about diet, weight, and exercise?  Eat a healthy diet    Eat a diet that includes plenty of vegetables, fruits, low-fat dairy products, and lean protein.  Do not eat a lot of foods that are high in solid fats, added sugars, or sodium.  Maintain a healthy weight  Body mass index (BMI) is a measurement that can be used to identify possible weight problems. It estimates body fat based on height and weight. Your health care provider can help determine your BMI and help you achieve or maintain a healthy weight.  Get regular exercise  Get regular exercise. This is one of the most important things you can do for your health. Most adults should:  Exercise for at least 150 minutes each week. The exercise should increase your heart rate and make you sweat (moderate-intensity exercise).  Do strengthening exercises at least twice a week. This is in addition to the moderate-intensity exercise.  Spend less time sitting. Even light physical activity can be beneficial.  Watch cholesterol and blood lipids  Have your blood tested for lipids and cholesterol at 56 years of age, then have this test every 5 years.  You may need to have your cholesterol levels checked more often if:  Your lipid or cholesterol levels are high.  You are older than 56 years of age.  You are at high risk for heart disease.  What should I know about cancer screening?  Many types of cancers can be detected early and may often be prevented. Depending on your health history and family history, you may need to have cancer screening at various ages. This may include screening for:  Colorectal cancer.  Prostate cancer.  Skin cancer.  Lung  cancer.  What should I know about heart disease, diabetes, and high blood pressure?  Blood pressure and heart disease  High blood pressure causes heart disease and increases the risk of stroke. This is more likely to develop in people who have high blood pressure readings or are overweight.  Talk with your health care provider about your target blood pressure readings.  Have your blood pressure checked:  Every 3-5 years if you are 9-95 years of age.  Every year if you are 85 years old or older.  If you are between the ages of 29 and 29 and are a current or former smoker, ask your health care provider if you should have a one-time screening for abdominal aortic aneurysm (AAA).  Diabetes  Have regular diabetes screenings. This checks your fasting blood sugar level. Have the screening done:  Once every three years after age 23 if you are at a normal weight and have a low risk for diabetes.  More often and at a younger age if you are overweight or have a high risk for diabetes.  What should I know about preventing infection?  Hepatitis B  If you have a higher risk for hepatitis B, you should be screened for this virus. Talk with your health care provider to find out if you are at risk for hepatitis B infection.  Hepatitis C  Blood testing is recommended for:  Everyone born from [birth year] through 1963.  Anyone  with known risk factors for hepatitis C.  Sexually transmitted infections (STIs)  You should be screened each year for STIs, including gonorrhea and chlamydia, if:  You are sexually active and are younger than 56 years of age.  You are older than 56 years of age and your health care provider tells you that you are at risk for this type of infection.  Your sexual activity has changed since you were last screened, and you are at increased risk for chlamydia or gonorrhea. Ask your health care provider if you are at risk.  Ask your health care provider about whether you are at high risk for HIV. Your health care provider  may recommend a prescription medicine to help prevent HIV infection. If you choose to take medicine to prevent HIV, you should first get tested for HIV. You should then be tested every 3 months for as long as you are taking the medicine.  Follow these instructions at home:  Alcohol use  Do not drink alcohol if your health care provider tells you not to drink.  If you drink alcohol:  Limit how much you have to 0-2 drinks a day.  Know how much alcohol is in your drink. In the [place] one drink equals one 12 oz bottle of beer (355 mL), one 5 oz glass of wine (148 mL), or one 1 oz glass of hard liquor (44 mL).  Lifestyle  Do not use any products that contain nicotine or tobacco. These products include cigarettes, chewing tobacco, and vaping devices, such as e-cigarettes. If you need help quitting, ask your health care provider.  Do not use street drugs.  Do not share needles.  Ask your health care provider for help if you need support or information about quitting drugs.  General instructions  Schedule regular health, dental, and eye exams.  Stay current with your vaccines.  Tell your health care provider if:  You often feel depressed.  You have ever been abused or do not feel safe at home.  Summary  Adopting a healthy lifestyle and getting preventive care are important in promoting health and wellness.  Follow your health care provider's instructions about healthy diet, exercising, and getting tested or screened for diseases.  Follow your health care provider's instructions on monitoring your cholesterol and blood pressure.  This information is not intended to replace advice given to you by your health care provider. Make sure you discuss any questions you have with your health care provider.  Document Revised: 07/24/2020 Document Reviewed: 07/24/2020  Elsevier Patient Education  2024 [employer].

## 2023-05-06 NOTE — Assessment & Plan Note (Signed)
 Systolic heart murmur on physical examination Possible LVH on EKG Needs echocardiogram May need cardiology evaluation after the

## 2023-05-06 NOTE — Progress Notes (Signed)
 Robert Johnson 58 y.o.   Chief Complaint  Patient presents with   Establish Care    HISTORY OF PRESENT ILLNESS: This is a 58 y.o. male here to establish care with me. History of dyslipidemia.  TIA in the past. Has some difficulty breathing sometimes. Also has history of hypertension but not on medication at present time. Accompanied by daughter.  She states he was recently told he has a heart murmur and need to follow-up. No other complaints or medical concerns today.  HPI   Prior to Admission medications   Medication Sig Start Date End Date Taking? Authorizing Provider  acetaminophen (TYLENOL) 500 MG tablet Take 500 mg by mouth every 6 (six) hours as needed.    [provider]  aspirin EC 81 MG tablet Take 81 mg by mouth daily. Swallow whole.    [provider]  Aspirin-Acetaminophen (GOODYS BODY PAIN PO) Take by mouth.    [provider]  rosuvastatin (CRESTOR) 20 MG tablet TAKE 1 TABLET(20 MG) BY MOUTH DAILY 01/25/22   Georgina Quint, MD  tamsulosin (FLOMAX) 0.4 MG CAPS capsule Take 1 capsule (0.4 mg total) by mouth daily. 08/19/22   Carlisle Beers, FNP    No Known Allergies  There are no active problems to display for this patient.   Past Medical History:  Diagnosis Date   Back problem    High cholesterol    Mini stroke    10/26    Past Surgical History:  Procedure Laterality Date   FINGER SURGERY Left 2007 or 2008   index    Social History   Socioeconomic History   Marital status: Married    Spouse name: Not on file   Number of children: 4   Years of education: Not on file   Highest education level: Not on file  Occupational History   Not on file  Tobacco Use   Smoking status: Former    Current packs/day: 0.00    Types: Cigarettes    Start date: 01/17/1987    Quit date: 01/17/2004    Years since quitting: 19.3   Smokeless tobacco: Never  Substance and Sexual Activity   Alcohol use: Yes    Comment: 1-2 beers once in  a while   Drug use: Never   Sexual activity: Not on file  Other Topics Concern   Not on file  Social History Narrative   Lives with wife, family   Social Drivers of Corporate investment banker Strain: Not on file  Food Insecurity: Not on file  Transportation Needs: Not on file  Physical Activity: Not on file  Stress: Not on file  Social Connections: Not on file  Intimate Partner Violence: Not on file    Family History  Problem Relation Age of Onset   Mental illness Mother    Hypertension Father      Review of Systems  Constitutional: Negative.  Negative for chills and fever.  HENT: Negative.  Negative for congestion and sore throat.   Respiratory: Negative.  Negative for cough and shortness of breath.   Cardiovascular: Negative.  Negative for chest pain and palpitations.  Gastrointestinal:  Negative for abdominal pain, diarrhea, nausea and vomiting.  Genitourinary: Negative.  Negative for dysuria and hematuria.  Skin: Negative.  Negative for rash.  Neurological: Negative.  Negative for dizziness and headaches.  All other systems reviewed and are negative.   Today's Vitals   05/06/23 0932  BP: (!) 144/80  Pulse: 87  Temp: 98.3  F (36.8 C)  TempSrc: Oral  SpO2: 97%  Weight: 153 lb 9.6 oz (69.7 kg)  Height: 5\' 7"  (1.702 m)   Body mass index is 24.06 kg/m. BP Readings from Last 3 Encounters:  05/06/23 (!) 144/80  08/19/22 (!) 151/89  01/17/20 122/67     Physical Exam Vitals reviewed.  Constitutional:      Appearance: Normal appearance.  HENT:     Head: Normocephalic.     Mouth/Throat:     Mouth: Mucous membranes are moist.     Pharynx: Oropharynx is clear.  Eyes:     Extraocular Movements: Extraocular movements intact.     Pupils: Pupils are equal, round, and reactive to light.  Cardiovascular:     Rate and Rhythm: Normal rate.     Heart sounds: Murmur heard.  Pulmonary:     Effort: Pulmonary effort is normal.     Breath sounds: Normal breath  sounds.  Abdominal:     Palpations: Abdomen is soft.     Tenderness: There is no abdominal tenderness.  Skin:    General: Skin is warm and dry.     Capillary Refill: Capillary refill takes less than 2 seconds.  Neurological:     General: No focal deficit present.     Mental Status: He is alert and oriented to person, place, and time.  Psychiatric:        Behavior: Behavior normal.   DG Chest 2 View Result Date: 05/06/2023 CLINICAL DATA:  Dyspnea. EXAM: CHEST - 2 VIEW COMPARISON:  None Available. FINDINGS: The heart size and mediastinal contours are within normal limits. Both lungs are clear. The visualized skeletal structures are unremarkable. IMPRESSION: No active cardiopulmonary disease. Electronically Signed   By: Danae Orleans M.D.   On: 05/06/2023 11:42    EKG: Normal sinus rhythm with ventricular rate of 73/min.  No acute ischemic changes.  Possible LVH.  ASSESSMENT & PLAN: A total of 47 minutes was spent with the patient and counseling/coordination of care regarding preparing for this visit, review of most recent office visit notes, establishing care with me, review of multiple chronic medical conditions and their management, cardiovascular risks associated with hypertension and dyslipidemia, comprehensive history and physical examination, review of all medications and changes made, review of most recent bloodwork results, review of health maintenance items, education on nutrition, prognosis, documentation, and need for follow up.   Problem List Items Addressed This Visit       Cardiovascular and Mediastinum   Essential hypertension - Primary   BP Readings from Last 3 Encounters:  05/06/23 (!) 144/80  08/19/22 (!) 151/89  01/17/20 122/67  Recommend to start lisinopril 20 mg daily Blood work done today Cardiovascular risks associated with hypertension discussed       Relevant Medications   rosuvastatin (CRESTOR) 20 MG tablet   lisinopril (ZESTRIL) 20 MG tablet     Other    Heart murmur   Systolic heart murmur on physical examination Possible LVH on EKG Needs echocardiogram May need cardiology evaluation after the      Relevant Orders   ECHOCARDIOGRAM COMPLETE   DG Chest 2 View (Completed)   EKG 12-Lead   History of TIA (transient ischemic attack)   Stroke prevention measures discussed Recommend lisinopril 20 mg for hypertension Rosuvastatin 20 mg for dyslipidemia Recommend daily baby aspirin      Dyslipidemia   Diet and nutrition discussed Lipid profile done today Recommend to restart rosuvastatin 20 mg daily      Relevant  Medications   rosuvastatin (CRESTOR) 20 MG tablet   Other Relevant Orders   CBC with Differential/Platelet   Comprehensive metabolic panel   Hemoglobin A1c   Lipid panel   Other Visit Diagnoses       Encounter to establish care          Patient Instructions  Health Maintenance, Male Adopting a healthy lifestyle and getting preventive care are important in promoting health and wellness. Ask your health care provider about: The right schedule for you to have regular tests and exams. Things you can do on your own to prevent diseases and keep yourself healthy. What should I know about diet, weight, and exercise? Eat a healthy diet  Eat a diet that includes plenty of vegetables, fruits, low-fat dairy products, and lean protein. Do not eat a lot of foods that are high in solid fats, added sugars, or sodium. Maintain a healthy weight Body mass index (BMI) is a measurement that can be used to identify possible weight problems. It estimates body fat based on height and weight. Your health care provider can help determine your BMI and help you achieve or maintain a healthy weight. Get regular exercise Get regular exercise. This is one of the most important things you can do for your health. Most adults should: Exercise for at least 150 minutes each week. The exercise should increase your heart rate and make you sweat  (moderate-intensity exercise). Do strengthening exercises at least twice a week. This is in addition to the moderate-intensity exercise. Spend less time sitting. Even light physical activity can be beneficial. Watch cholesterol and blood lipids Have your blood tested for lipids and cholesterol at 56 years of age, then have this test every 5 years. You may need to have your cholesterol levels checked more often if: Your lipid or cholesterol levels are high. You are older than 56 years of age. You are at high risk for heart disease. What should I know about cancer screening? Many types of cancers can be detected early and may often be prevented. Depending on your health history and family history, you may need to have cancer screening at various ages. This may include screening for: Colorectal cancer. Prostate cancer. Skin cancer. Lung cancer. What should I know about heart disease, diabetes, and high blood pressure? Blood pressure and heart disease High blood pressure causes heart disease and increases the risk of stroke. This is more likely to develop in people who have high blood pressure readings or are overweight. Talk with your health care provider about your target blood pressure readings. Have your blood pressure checked: Every 3-5 years if you are 58-36 years of age. Every year if you are 42 years old or older. If you are between the ages of 67 and 73 and are a current or former smoker, ask your health care provider if you should have a one-time screening for abdominal aortic aneurysm (AAA). Diabetes Have regular diabetes screenings. This checks your fasting blood sugar level. Have the screening done: Once every three years after age 71 if you are at a normal weight and have a low risk for diabetes. More often and at a younger age if you are overweight or have a high risk for diabetes. What should I know about preventing infection? Hepatitis B If you have a higher risk for  hepatitis B, you should be screened for this virus. Talk with your health care provider to find out if you are at risk for hepatitis B infection. Hepatitis  C Blood testing is recommended for: Everyone born from [birth year] through 1963. Anyone with known risk factors for hepatitis C. Sexually transmitted infections (STIs) You should be screened each year for STIs, including gonorrhea and chlamydia, if: You are sexually active and are younger than 57 years of age. You are older than 56 years of age and your health care provider tells you that you are at risk for this type of infection. Your sexual activity has changed since you were last screened, and you are at increased risk for chlamydia or gonorrhea. Ask your health care provider if you are at risk. Ask your health care provider about whether you are at high risk for HIV. Your health care provider may recommend a prescription medicine to help prevent HIV infection. If you choose to take medicine to prevent HIV, you should first get tested for HIV. You should then be tested every 3 months for as long as you are taking the medicine. Follow these instructions at home: Alcohol use Do not drink alcohol if your health care provider tells you not to drink. If you drink alcohol: Limit how much you have to 0-2 drinks a day. Know how much alcohol is in your drink. In the [place] one drink equals one 12 oz bottle of beer (355 mL), one 5 oz glass of wine (148 mL), or one 1 oz glass of hard liquor (44 mL). Lifestyle Do not use any products that contain nicotine or tobacco. These products include cigarettes, chewing tobacco, and vaping devices, such as e-cigarettes. If you need help quitting, ask your health care provider. Do not use street drugs. Do not share needles. Ask your health care provider for help if you need support or information about quitting drugs. General instructions Schedule regular health, dental, and eye exams. Stay current with your  vaccines. Tell your health care provider if: You often feel depressed. You have ever been abused or do not feel safe at home. Summary Adopting a healthy lifestyle and getting preventive care are important in promoting health and wellness. Follow your health care provider's instructions about healthy diet, exercising, and getting tested or screened for diseases. Follow your health care provider's instructions on monitoring your cholesterol and blood pressure. This information is not intended to replace advice given to you by your health care provider. Make sure you discuss any questions you have with your health care provider. Document Revised: 07/24/2020 Document Reviewed: 07/24/2020 Elsevier Patient Education  2024 [employer]     Edwina Barth, MD [facility]

## 2023-05-06 NOTE — Assessment & Plan Note (Signed)
 Stroke prevention measures discussed Recommend lisinopril 20 mg for hypertension Rosuvastatin 20 mg for dyslipidemia Recommend daily baby aspirin

## 2023-05-06 NOTE — Assessment & Plan Note (Signed)
 Diet and nutrition discussed Lipid profile done today Recommend to restart rosuvastatin 20 mg daily

## 2023-06-12 ENCOUNTER — Ambulatory Visit (HOSPITAL_COMMUNITY)
Admission: RE | Admit: 2023-06-12 | Discharge: 2023-06-12 | Disposition: A | Discharge status: Home / Self Care | Payer: Self-pay | Source: Ambulatory Visit | Attending: Emergency Medicine | Admitting: Emergency Medicine

## 2023-06-12 DIAGNOSIS — R011 Cardiac murmur, unspecified: Secondary | ICD-10-CM | POA: Insufficient documentation

## 2023-06-12 DIAGNOSIS — I081 Rheumatic disorders of both mitral and tricuspid valves: Secondary | ICD-10-CM | POA: Insufficient documentation

## 2023-06-12 DIAGNOSIS — E785 Hyperlipidemia, unspecified: Secondary | ICD-10-CM | POA: Insufficient documentation

## 2023-06-12 DIAGNOSIS — Z8673 Personal history of transient ischemic attack (TIA), and cerebral infarction without residual deficits: Secondary | ICD-10-CM | POA: Insufficient documentation

## 2023-06-12 DIAGNOSIS — I10 Essential (primary) hypertension: Secondary | ICD-10-CM | POA: Insufficient documentation

## 2023-06-12 LAB — ECHOCARDIOGRAM COMPLETE
AR max vel: 2.33 cm2
AV Area VTI: 2.19 cm2
AV Area mean vel: 1.86 cm2
AV Mean grad: 4 mmHg
AV Peak grad: 9.1 mmHg
Ao pk vel: 1.51 m/s
Area-P 1/2: 4.12 cm2
Calc EF: 65.9 %
MV M vel: 4.18 m/s
MV Peak grad: 69.7 mmHg
MV VTI: 1.71 cm2
Radius: 0.93 cm
S' Lateral: 3.2 cm
Single Plane A2C EF: 68.6 %
Single Plane A4C EF: 60.2 %

## 2023-06-12 NOTE — Progress Notes (Signed)
*  PRELIMINARY RESULTS* Echocardiogram 2D Echocardiogram has been performed.  Robert Johnson 06/12/2023, 1:58 PM

## 2023-08-05 ENCOUNTER — Ambulatory Visit: Payer: Self-pay | Admitting: Emergency Medicine

## 2024-02-25 ENCOUNTER — Ambulatory Visit: Admitting: Emergency Medicine

## 2024-02-25 ENCOUNTER — Encounter: Payer: Self-pay | Admitting: Emergency Medicine

## 2024-02-25 ENCOUNTER — Ambulatory Visit: Payer: Self-pay | Admitting: Emergency Medicine

## 2024-02-25 VITALS — BP 140/100 | HR 64 | Temp 98.1°F | Ht 67.0 in | Wt 160.0 lb

## 2024-02-25 DIAGNOSIS — Z125 Encounter for screening for malignant neoplasm of prostate: Secondary | ICD-10-CM

## 2024-02-25 DIAGNOSIS — I10 Essential (primary) hypertension: Secondary | ICD-10-CM

## 2024-02-25 DIAGNOSIS — R300 Dysuria: Secondary | ICD-10-CM | POA: Diagnosis not present

## 2024-02-25 DIAGNOSIS — E785 Hyperlipidemia, unspecified: Secondary | ICD-10-CM | POA: Diagnosis not present

## 2024-02-25 DIAGNOSIS — Z130 Encounter for screening for diseases of the blood and blood-forming organs and certain disorders involving the immune mechanism: Secondary | ICD-10-CM

## 2024-02-25 DIAGNOSIS — R011 Cardiac murmur, unspecified: Secondary | ICD-10-CM | POA: Diagnosis not present

## 2024-02-25 DIAGNOSIS — I340 Nonrheumatic mitral (valve) insufficiency: Secondary | ICD-10-CM | POA: Insufficient documentation

## 2024-02-25 DIAGNOSIS — Z1211 Encounter for screening for malignant neoplasm of colon: Secondary | ICD-10-CM

## 2024-02-25 DIAGNOSIS — Z13228 Encounter for screening for other metabolic disorders: Secondary | ICD-10-CM

## 2024-02-25 DIAGNOSIS — Z1329 Encounter for screening for other suspected endocrine disorder: Secondary | ICD-10-CM | POA: Diagnosis not present

## 2024-02-25 DIAGNOSIS — Z0001 Encounter for general adult medical examination with abnormal findings: Secondary | ICD-10-CM | POA: Diagnosis not present

## 2024-02-25 DIAGNOSIS — I38 Endocarditis, valve unspecified: Secondary | ICD-10-CM | POA: Diagnosis not present

## 2024-02-25 LAB — COMPREHENSIVE METABOLIC PANEL WITH GFR
ALT: 33 U/L (ref 0–53)
AST: 19 U/L (ref 0–37)
Albumin: 4.8 g/dL (ref 3.5–5.2)
Alkaline Phosphatase: 70 U/L (ref 39–117)
BUN: 13 mg/dL (ref 6–23)
CO2: 30 meq/L (ref 19–32)
Calcium: 9.9 mg/dL (ref 8.4–10.5)
Chloride: 102 meq/L (ref 96–112)
Creatinine, Ser: 0.87 mg/dL (ref 0.40–1.50)
GFR: 96.17 mL/min (ref >60.00)
Glucose, Bld: 96 mg/dL (ref 70–99)
Potassium: 3.9 meq/L (ref 3.5–5.1)
Sodium: 139 meq/L (ref 135–145)
Total Bilirubin: 1.3 mg/dL — ABNORMAL HIGH (ref 0.2–1.2)
Total Protein: 7.1 g/dL (ref 6.0–8.3)

## 2024-02-25 LAB — CBC WITH DIFFERENTIAL/PLATELET
Basophils Absolute: 0.2 K/uL — ABNORMAL HIGH (ref 0.0–0.1)
Basophils Relative: 2 % (ref 0.0–3.0)
Eosinophils Absolute: 1.3 K/uL — ABNORMAL HIGH (ref 0.0–0.7)
Eosinophils Relative: 16.7 % — ABNORMAL HIGH (ref 0.0–5.0)
HCT: 43.1 % (ref 39.0–52.0)
Hemoglobin: 13.5 g/dL (ref 13.0–17.0)
Lymphocytes Relative: 26.8 % (ref 12.0–46.0)
Lymphs Abs: 2 K/uL (ref 0.7–4.0)
MCHC: 31.2 g/dL (ref 30.0–36.0)
MCV: 73 fl — ABNORMAL LOW (ref 78.0–100.0)
Monocytes Absolute: 0.5 K/uL (ref 0.1–1.0)
Monocytes Relative: 6.7 % (ref 3.0–12.0)
Neutro Abs: 3.7 K/uL (ref 1.4–7.7)
Neutrophils Relative %: 47.8 % (ref 43.0–77.0)
Platelets: 225 K/uL (ref 150.0–400.0)
RBC: 5.9 Mil/uL — ABNORMAL HIGH (ref 4.22–5.81)
RDW: 14.3 % (ref 11.5–15.5)
WBC: 7.6 K/uL (ref 4.0–10.5)

## 2024-02-25 LAB — URINALYSIS
Bilirubin Urine: NEGATIVE
Hgb urine dipstick: NEGATIVE
Ketones, ur: NEGATIVE
Leukocytes,Ua: NEGATIVE
Nitrite: NEGATIVE
Specific Gravity, Urine: 1.025 (ref 1.000–1.030)
Total Protein, Urine: NEGATIVE
Urine Glucose: NEGATIVE
Urobilinogen, UA: 0.2 (ref 0.0–1.0)
pH: 6 (ref 5.0–8.0)

## 2024-02-25 LAB — LIPID PANEL
Cholesterol: 294 mg/dL — ABNORMAL HIGH (ref 0–200)
HDL: 48.2 mg/dL (ref >39.00)
LDL Cholesterol: 187 mg/dL — ABNORMAL HIGH (ref 0–99)
NonHDL: 246.16
Total CHOL/HDL Ratio: 6
Triglycerides: 297 mg/dL — ABNORMAL HIGH (ref 0.0–149.0)
VLDL: 59.4 mg/dL — ABNORMAL HIGH (ref 0.0–40.0)

## 2024-02-25 LAB — PSA: PSA: 0.53 ng/mL (ref 0.10–4.00)

## 2024-02-25 LAB — HEMOGLOBIN A1C: Hgb A1c MFr Bld: 4.8 % (ref 4.6–6.5)

## 2024-02-25 MED ORDER — LISINOPRIL 20 MG PO TABS: 20.0000 mg | ORAL_TABLET | Freq: Every day | ORAL | 3 refills | Status: AC

## 2024-02-25 MED ORDER — ROSUVASTATIN CALCIUM 20 MG PO TABS: 20.0000 mg | ORAL_TABLET | Freq: Every day | ORAL | 3 refills | Status: AC

## 2024-02-25 NOTE — Assessment & Plan Note (Signed)
 Severe mitral valve regurgitation

## 2024-02-25 NOTE — Assessment & Plan Note (Signed)
 Diet and nutrition discussed Lipid profile done today Recommend to restart rosuvastatin 20 mg daily

## 2024-02-25 NOTE — Assessment & Plan Note (Signed)
Recommend urinalysis and urine culture today

## 2024-02-25 NOTE — Patient Instructions (Addendum)
 Start lisinopril  20 mg daily Monitor blood pressure readings at home daily for the next several weeks and contact the office if numbers persistently abnormal Blood work today Needs cardiology evaluation for valvular heart disease, mitral regurgitation Follow-up in 3 months for hypertension  Qu?n l t?ng huy?t p Managing Your Hypertension T?ng huy?t p, cn ???c g?i l huy?t p cao, l khi l?c b?m mu p vo thnh ??ng m?ch qu m?nh. Cc ??ng m?ch l cc m?ch mu mang mu t? tim ?i kh?p c? th? qu v?. T?ng huy?t p bu?c tim ph?i lm vi?c nhi?u h?n ?? b?m mu v c th? khi?n cho cc ??ng m?ch b? h?p ho?c c?ng. Tm hi?u v? cc ch? s? huy?t p Ch? s? huy?t p g?m m?t ch? s? cao trn m?t ch? s? th?p: Ch? s? ??u tin, hay ch? s? cao nh?t, ???c g?i l huy?t p tm thu. ?y l s? ?o p su?t trong ??ng m?ch khi tim qu v? ??p. Ch? s? th? hai, hay ch? s? th?p nh?t, ???c g?i l huy?t p tm tr??ng. ?y l s? ?o p su?t trong ??ng m?ch khi tim qu v? gin ra. ??i v?i h?u h?t m?i ng??i, huy?t p bnh th??ng l d??i 120/80. Huy?t p m?c tiu c nhn c?a qu v? c th? khc nhau ty thu?c v tnh tr?ng b?nh l, tu?i v cc y?u t? khc. Huy?t p ???c phn lo?i thnh b?n giai ?o?n. D?a trn ch? s? huy?t p c?a qu v?, chuyn gia ch?m [place] s?c kh?e c?a qu v? c th? s? d?ng nh?ng giai ?o?n sau ?y ?? xc ??nh lo?i ?i?u tr? qu v? c?n, n?u c. Huy?t p tm thu v huy?t p tm tr??ng ???c ?o theo ??n v? mm th?y ngn (mmHg). Bnh th??ng Huy?t p tm thu: d??i 120. Huy?t p tm tr??ng: d??i 80. Cao Huy?t p tm thu: 120-129. Huy?t p tm tr??ng: d??i 80. T?ng huy?t p giai ?o?n 1 Huy?t p tm thu: 130-139. Huy?t p tm tr??ng: 80-89. T?ng huy?t p giai ?o?n 2 Huy?t p tm thu: t? 140 tr? ln. Huy?t p tm tr??ng: t? 90 tr? ln. Tnh tr?ng ny c th? ?nh h??ng ??n ti nh? th? no? X? tr t?ng huy?t p c?a qu v? l r?t quan tr?ng. Theo th?i gian, t?ng huy?t p c th? gy t?n th??ng cc ??ng m?ch v lm gi?m l?u  l??ng mu ??n cc b? ph?n c?a c? th?, g?m c? no, tim v th?n. T?ng huy?t p khng ???c ki?m sot ho?c ?i?u tr? c th? d?n t?i: Nh?i mu c? tim. ??t qu?SABRA M?ch mu b? y?u (phnh m?ch). Suy tim. T?n th??ng th?n. T?n th??ng m?t. Cc v?n ?? v? t?p trung v tr nh?. Sa st tr tu? do m?ch mu. Ti c th? th??c hi?n nh?ng hnh ??ng no ?? ki?m sot tnh tr?ng ny? C th? qu?n l t?ng huy?t p b?ng cch thay ??i l?i s?ng v c th? l b?ng cch dng thu?c. Chuyn gia ch?m [place] s?c kh?e c?a qu v? s? gip qu v? ln k? ho?ch ??a huy?t p v? gi?i h?n bnh th??ng. Qu v? c th? ???c gi?i thi?u ?? ???c t? v?n v? ch? ?? ?n lnh m?nh v ho?t ??ng th? ch?t. Dinh d??ng  ?n ch? ?? giu ch?t x? v kali v t mu?i (natri), t ???ng b? sung v t ch?t [place]. M?t k? ho?ch ?n ki?u m?u ???c g?i l ch? ?? ?n DASH. DASH l vi?t t?t c?a Dietary Approaches  to Stop Hypertension (Ph??ng php ti?p c?n ch? ?? ?n u?ng ?? lm gi?m huy?t p). ?n theo cch ny: ?n nhi?u tri cy v rau c? t??i. Vo m?i b?a ?n, c? g?ng dnh m?t n?a ??a cho tri cy v rau c?. ?n ng? c?c nguyn cm, ch?ng h?n nh? m ?ng lm t? b?t m nguyn cm, g?o l?t, ho?c bnh m t? b?t m nguyn cm. Cho ngu? c?c nguyn ca?m va?o kho?ng m?t ph?n t? ??a. ?n cc s?n ph?m s?a t [place]. Trnh nh?ng mi?ng th?t nhi?u m?, th?t ch? bi?n s?n ho?c th?t ??p mu?i v th?t gia c?m c da. Dnh kho?ng m?t ph?n t? ??a c?a qu v? cho cc protein n?c, ch?ng h?n nh? c, th?t g khng da, ??u, tr?ng v ??u ph?. Trnh nh?ng th?c ph?m ch? bi?n s?n ho?c lm s?n. Nh?ng th?c ph?m ny th??ng c nhi?u natri, b? sung ???ng v ch?t [place] h?n. Gi?m l??ng dng natri hng ngy c?a qu v?. Nhi?u ng??i b? t?ng huy?t p c?n ?n d??i 1.500 mg natri m?i ngy. L?i s?ng  H?p tc v?i chuyn gia ch?m [place] s?c kh?e c?a qu v? ?? duy tr tr?ng l??ng c? th? c l?i cho s?c kh?e ho?c ?? gi?m cn. Hy h?i xem tr?ng l??ng no l l t??ng cho qu v?. Dnh t nh?t 30 pht t?p th? d?c c th? khi?n tim qu v? ??p  nhanh h?n (t?p th? d?c nh?p ?i?u) h?u h?t cc ngy trong tu?n. Cc ho?t ??ng c th? bao g?m ?i b?, b?i, ho?c ??p xe. ??a vo bi t?p t?ng s?c m?nh c? b?p (bi t?p khng l?c), ch?ng h?n nh? nng t?, trong khun kh? thi quen luy?n t?p hng tu?n c?a qu v?. C? g?ng t?p nh?ng lo?i bi t?p ny trong vng 30 pht, t nh?t l 3 ngy m?i tu?n. Khng s? d?ng b?t k? s?n ph?m no c nicotine ho?c thu?c l. Nh?ng s?n ph?m ny bao g?m thu?c l d?ng ht, thu?c l d?ng nhai v d?ng c? ht thu?c, ch?ng h?n nh? thu?c l ?i?n t?. N?u qu v? c?n gip ?? ?? cai thu?c, hy h?i chuyn gia ch?m [place] s?c kh?e. Ki?m sot b?t k? tnh tr?ng ko di (m?n tnh) no m qu v? c, ch?ng h?n nh? cholesterol cao ho?c ti?u ???ng. Xc ??nh cc ngu?n gy c?ng th?ng v tm cch qu?n l c?ng th?ng. Vi?c ny c th? bao g?m thi?n ??nh, th? su ho?c dnh th?i gian cho cc ho?t ??ng vui v?. S? d?ng r??u Khng u?ng r??u n?u: Chuyn gia ch?m [place] s?c kh?e khuyn qu v? khng u?ng r??u. Qu v? c thai, c th? c thai, ho?c ?ang c k? ho?ch c thai. N?u qu v? u?ng r??u: Gi?i h?n l??ng r??u qu v? u?ng ? m?c: 0-1 ly/ngy ??i v?i n? gi?i. 0-2 ly/ngy ??i v?i nam gi?i. Bi?t m?t ly c bao nhiu r??u. ? M?, m?t ly t??ng ???ng v?i m?t chai bia 12 ao-x? (355 mL), m?t ly r??u vang 5 ao-x? (148 mL), ho?c m?t ly r??u m?nh 1 ao-x? (44 mL). Thu?c Chuyn gia ch?m [place] s?c kh?e c th? k ??n thu?c n?u thay ??i l?i s?ng khng ?? ?? ??a huy?t p v? m?c c th? ki?m sot ???c v n?u: Huy?t p tm thu c?a qu v? t? 130 tr? ln. Huy?t p tm tr??ng c?a qu v? t? 80 tr? ln. Ch? s? d?ng thu?c theo h??ng d?n c?a chuyn gia ch?m [place] s?c kh?e cu?a quy? vi?. Lm theo  ch? d?n m?t cch c?n th?n. Thu?c ?i?u tr? huy?t p ph?i ???c dng theo ch? d?n c?a chuyn gia ch?m [place] s?c kh?e. Thu?c c?ng s? khng c tc d?ng khi qu v? b? dng thu?c. Vi?c b? dng thu?c c?ng lm qu v? c nguy c? pht sinh v?n ??. Theo di Tr??c khi qu v? theo di huy?t p: Khng ht thu?c, u?ng  ?? u?ng ch?a caffein, ho?c t?p th? d?c trong vng 30 pht tr??c khi ?o. ?i v? sinh v ?i ti?u (ti?u ti?n). Ng?i yn t?nh trong t nh?t 5 pht tr??c khi ?o. Theo di huy?t p c?a qu v? t?i nh theo h??ng d?n c?a chuyn gia ch?m [place] s?c kh?e. ?? lm ?i?u ny: Ng?i th?ng v c t?a l?ng. ?? hai bn chn b?ng ph?ng trn sn nh. Khng b?t cho chn. Ch?ng cnh tay trn m?t b? m?t ph?ng, ch?ng h?n nh? bn. ??m b?o r?ng ph?n cnh tay trn c?a qu v? ngang b?ng tim. M?i l?n qu v? ?o, hy l?y hai ho?c ba ch? s? cch nhau m?t pht v ghi l?i cc k?t qu? ?. Qu v? c?ng c th? c?n c chuyn gia ch?m [place] s?c kho? ki?m tra huy?t p c?a qu v? th??ng xuyn. Thng tin chung Trao ??i v?i chuyn gia ch?m [place] s?c kh?e c?a qu v? v? ch? ?? ?n, thi qun luy?n t?p v cc y?u t? l?i s?ng khc c th? gp ph?n lm t?ng huy?t p. Cng v?i chuyn gia ch?m [place] s?c kh?e xem xt l?i t?t c? cc lo?i thu?c qu v? dng b?i v c th? c tc d?ng ph? ho?c t??ng tc thu?c. Tun th? theo t?t c? cc l?n khm l?i. Chuyn gia ch?m [place] s?c kh?e c?a qu v? c th? gip qu v? t?o ra v ?i?u ch?nh k? ho?ch ki?m sot huy?t p cao. N?i tm thm thng tin [employer] (Vi?n Tim, Ph?i v Mu Qu?c gia): [email or web address] [employer] (Hi?p h?i Tim m?ch [place]?): www.heart.org Hy lin l?c v?i chuyn gia ch?m [place] s?c kh?e n?u: Qu v? ngh? l qu v? c ph?n ?ng v?i cc lo?i thu?c ? dng. Qu v? b? ?au ??u l?p ?i l?p l?i (ti pht). Qu v? c?m th?y chng m?t. Qu v? b? s?ng ? c? chn. Qu v? c v?n ?? v? th? l?c. Yu c?u tr? gip ngay l?p t?c n?u: Qu v? b? ?au ??u r?t nhi?u ho?c l l?n. Qu v? b? y?u b?t th??ng ho?c t b, ho?c c?m th?y b? ng?t. Qu v? b? ?au r?t nhi?u ? ng?c ho?c b?ng. Qu v? nn nhi?u l?n. Qu v? b? kh th?. Nh?ng tri?u ch?ng ny c th? l tr??ng h?p c?p c?u. Yu c?u tr? gip ngay l?p t?c. Hy g?i 911. Khng ch? xem tri?u ch?ng c h?t khng. Khng t? li xe ??n b?nh  vi?n. Tm t?t T?ng huy?t p l khi l?c b?m mu qua cc ??ng m?ch c?a qu v? qu m?nh. N?u tnh tr?ng ny khng ???c ki?m sot, n c th? khi?n qu v? c nguy c? b? cc bi?n ch?ng nghim tr?ng. Huy?t p m?c tiu c nhn c?a qu v? c th? khc nhau ty thu?c v tnh tr?ng b?nh l, tu?i v cc y?u t? khc. ??i v?i h?u h?t m?i ng??i, huy?t p bnh th??ng l d??i 120/80. Ki?m sot t?ng huy?t p b?ng cch thay ??i l?i s?ng, dng thu?c, ho?c c? hai. Thay ??i l?i s?ng ?? gip ki?m sot t?ng huy?t  p bao g?m gi?m cn, ?n ch? ?? ?n c l?i cho s?c kh?e, t natri, t?p th? d?c nhi?u h?n, ng?ng ht thu?c v h?n ch? u?ng r??u. Thng tin ny khng nh?m m?c ?ch thay th? cho l?i khuyn m chuyn gia ch?m [place] s?c kh?e ni v?i qu v?. Hy b?o ??m qu v? ph?i th?o lu?n b?t k? v?n ?? g m qu v? c v?i chuyn gia ch?m [place] s?c kh?e c?a qu v?. Document Revised: 01/10/2021 Document Reviewed: 01/10/2021 [employer] Patient Education  2024 [employer].

## 2024-02-25 NOTE — Assessment & Plan Note (Signed)
 Severe mitral regurgitation shown on echocardiogram March 2025 Contributing to dyspnea on exertion Needs cardiology evaluation Referral placed today

## 2024-02-25 NOTE — Assessment & Plan Note (Signed)
 Secondary to mitral regurgitation Echocardiogram report from March 2025 reviewed Did not follow-up with cardiology due to insurance issues New referral placed today

## 2024-02-25 NOTE — Progress Notes (Signed)
 Robert Johnson 58 y.o.   Chief Complaint  Patient presents with   Annual Exam    Pt states that the lower back have been causing him a lot of issues and he states that he feel very fatigued when walking long distance or sometimes sitting down, also patient states that when he urinates it burns and this has been going on for two months     HISTORY OF PRESENT ILLNESS: This is a 58 y.o. male accompanied by daughter and interpreter Originally from [place] Here for annual exam Also history of hypertension History of chronic left hip/low back pain for at least 5 years.  Has seen multiple orthopedic surgeons, sports medicine, physical therapy.  He was recommended against surgery Has also some intermittent burning when he pees for the last 2 months Also complaining of feeling fatigue mostly on exertion No other complaints or medical concerns today.  HPI   Prior to Admission medications   Medication Sig Start Date End Date Taking? Authorizing Provider  acetaminophen (TYLENOL) 500 MG tablet Take 500 mg by mouth every 6 (six) hours as needed.   Yes [provider]  lisinopril  (ZESTRIL ) 20 MG tablet Take 1 tablet (20 mg total) by mouth daily. 05/06/23  Yes Lewie Deman, Emil Schanz, MD  rosuvastatin  (CRESTOR ) 20 MG tablet Take 1 tablet (20 mg total) by mouth daily. 05/06/23  Yes [place] Emil Schanz, MD  aspirin EC 81 MG tablet Take 81 mg by mouth daily. Swallow whole. Patient not taking: Reported on 02/25/2024    [provider]  Aspirin-Acetaminophen (GOODYS BODY PAIN PO) Take by mouth. Patient not taking: Reported on 02/25/2024    [provider]  tamsulosin  (FLOMAX ) 0.4 MG CAPS capsule Take 1 capsule (0.4 mg total) by mouth daily. Patient not taking: Reported on 02/25/2024 08/19/22   Enedelia Dorna HERO, FNP    No Known Allergies  Patient Active Problem List   Diagnosis Date Noted   Heart murmur 05/06/2023   History of TIA (transient ischemic attack) 05/06/2023    Dyslipidemia 05/06/2023   Essential hypertension 05/06/2023    Past Medical History:  Diagnosis Date   Back problem    High cholesterol    Mini stroke    10/26    Past Surgical History:  Procedure Laterality Date   FINGER SURGERY Left 2007 or 2008   index    Social History   Socioeconomic History   Marital status: Married    Spouse name: nhoan kopa   Number of children: 4   Years of education: Not on file   Highest education level: Not on file  Occupational History   Not on file  Tobacco Use   Smoking status: Former    Current packs/day: 0.00    Types: Cigarettes    Start date: 01/17/1987    Quit date: 01/17/2004    Years since quitting: 20.1   Smokeless tobacco: Never  Vaping Use   Vaping status: Never Used  Substance and Sexual Activity   Alcohol use: Not Currently    Comment: 1-2 beers once in a while   Drug use: Never   Sexual activity: Yes  Other Topics Concern   Not on file  Social History Narrative   Lives with wife, family   Social Drivers of Corporate Investment Banker Strain: Not on file  Food Insecurity: Not on file  Transportation Needs: Not on file  Physical Activity: Not on file  Stress: Not on file  Social Connections: Not on file  Intimate Partner Violence: Not on file    Family History  Problem Relation Age of Onset   Mental illness Mother    Alzheimer's disease Mother    Heart disease Father    Hypertension Father    Schizophrenia Son      Review of Systems  Constitutional:  Positive for malaise/fatigue. Negative for chills and fever.  HENT: Negative.  Negative for congestion and sore throat.   Respiratory:  Positive for shortness of breath (Dyspnea on exertion). Negative for cough.   Cardiovascular:  Negative for chest pain and palpitations.  Gastrointestinal:  Negative for abdominal pain, diarrhea, nausea and vomiting.  Genitourinary: Negative.  Negative for dysuria and hematuria.  Skin: Negative.  Negative for rash.   Neurological: Negative.  Negative for dizziness and headaches.  All other systems reviewed and are negative.   Vitals:   02/25/24 0849 02/25/24 0857  BP: (!) 120/100 (!) 120/100  Pulse: 64   Temp: 98.1 F (36.7 C)   SpO2: 99%     Physical Exam Vitals reviewed.  Constitutional:      Appearance: Normal appearance.  HENT:     Head: Normocephalic.     Mouth/Throat:     Mouth: Mucous membranes are moist.     Pharynx: Oropharynx is clear.  Eyes:     Extraocular Movements: Extraocular movements intact.     Conjunctiva/sclera: Conjunctivae normal.     Pupils: Pupils are equal, round, and reactive to light.  Cardiovascular:     Rate and Rhythm: Normal rate and regular rhythm.     Pulses: Normal pulses.     Heart sounds: Murmur heard.  Pulmonary:     Effort: Pulmonary effort is normal.     Breath sounds: Normal breath sounds.  Musculoskeletal:     Cervical back: No tenderness.  Lymphadenopathy:     Cervical: No cervical adenopathy.  Skin:    General: Skin is warm and dry.     Capillary Refill: Capillary refill takes less than 2 seconds.  Neurological:     General: No focal deficit present.     Mental Status: He is alert and oriented to person, place, and time.  Psychiatric:        Mood and Affect: Mood normal.        Behavior: Behavior normal.      ASSESSMENT & PLAN:  Problem List Items Addressed This Visit       Cardiovascular and Mediastinum   Essential hypertension   Uncontrolled hypertension with elevated blood pressure readings at home and in the office Cardiovascular risks associated with uncontrolled hypertension discussed Diet and nutrition discussed Presently not taking medication Recommend lisinopril  20 mg daily Advised to monitor blood pressure readings at home daily for the next several weeks and contact the office if numbers persistently abnormal Blood work today Follow-up in 6 months      Relevant Medications   lisinopril  (ZESTRIL ) 20 MG  tablet   rosuvastatin  (CRESTOR ) 20 MG tablet   Other Relevant Orders   CBC with Differential/Platelet   Hemoglobin A1c   Comprehensive metabolic panel with GFR   Lipid panel   Valvular heart disease   Severe mitral valve regurgitation      Relevant Medications   lisinopril  (ZESTRIL ) 20 MG tablet   rosuvastatin  (CRESTOR ) 20 MG tablet   Other Relevant Orders   Ambulatory referral to Cardiology   Mitral valve insufficiency   Severe mitral regurgitation shown on echocardiogram March 2025 Contributing to dyspnea on exertion Needs cardiology evaluation  Referral placed today      Relevant Medications   lisinopril  (ZESTRIL ) 20 MG tablet   rosuvastatin  (CRESTOR ) 20 MG tablet   Other Relevant Orders   Ambulatory referral to Cardiology     Other   Heart murmur   Secondary to mitral regurgitation Echocardiogram report from March 2025 reviewed Did not follow-up with cardiology due to insurance issues New referral placed today      Dyslipidemia   Diet and nutrition discussed Lipid profile done today Recommend to restart rosuvastatin  20 mg daily      Relevant Medications   rosuvastatin  (CRESTOR ) 20 MG tablet   Other Relevant Orders   CBC with Differential/Platelet   Hemoglobin A1c   Comprehensive metabolic panel with GFR   Lipid panel   Dysuria   Recommend urinalysis and urine culture today      Relevant Orders   Urinalysis   Urine Culture   Other Visit Diagnoses       Encounter for general adult medical examination with abnormal findings    -  Primary   Relevant Orders   CBC with Differential/Platelet   Hemoglobin A1c   Comprehensive metabolic panel with GFR     Screening for prostate cancer       Relevant Orders   PSA     Screening for deficiency anemia       Relevant Orders   CBC with Differential/Platelet     Screening for endocrine, metabolic and immunity disorder       Relevant Orders   Hemoglobin A1c   Comprehensive metabolic panel with GFR       Modifiable risk factors discussed with patient. Anticipatory guidance according to age provided. The following topics were also discussed: Social Determinants of Health Smoking.  Non-smoker Diet and nutrition Benefits of exercise Cancer screening and need for colon cancer screening with colonoscopy Vaccinations review and recommendations Cardiovascular risk assessment The 10-year ASCVD risk score (Arnett DK, et al., 2019) is: 9.9%   Values used to calculate the score:     Age: 3 years     Clincally relevant sex: Male     Is Non-Hispanic African American: No     Diabetic: No     Tobacco smoker: No     Systolic Blood Pressure: 140 mmHg     Is BP treated: Yes     HDL Cholesterol: 47.4 mg/dL     Total Cholesterol: 226 mg/dL Review multiple chronic medical conditions under management Need for cardiology evaluation for valvular heart disease Review of all medications and changes made Mental health including depression and anxiety Fall and accident prevention  Patient Instructions  Start lisinopril  20 mg daily Monitor blood pressure readings at home daily for the next several weeks and contact the office if numbers persistently abnormal Blood work today Needs cardiology evaluation for valvular heart disease, mitral regurgitation Follow-up in 3 months for hypertension  Qu?n l t?ng huy?t p Managing Your Hypertension T?ng huy?t p, cn ???c g?i l huy?t p cao, l khi l?c b?m mu p vo thnh ??ng m?ch qu m?nh. Cc ??ng m?ch l cc m?ch mu mang mu t? tim ?i kh?p c? th? qu v?. T?ng huy?t p bu?c tim ph?i lm vi?c nhi?u h?n ?? b?m mu v c th? khi?n cho cc ??ng m?ch b? h?p ho?c c?ng. Tm hi?u v? cc ch? s? huy?t p Ch? s? huy?t p g?m m?t ch? s? cao trn m?t ch? s? th?p: Ch? s? ??u tin, hay ch? s? cao nh?t, ???c  g?i l huy?t p tm thu. ?y l s? ?o p su?t trong ??ng m?ch khi tim qu v? ??p. Ch? s? th? hai, hay ch? s? th?p nh?t, ???c g?i l huy?t p tm tr??ng. ?y l s? ?o  p su?t trong ??ng m?ch khi tim qu v? gin ra. ??i v?i h?u h?t m?i ng??i, huy?t p bnh th??ng l d??i 120/80. Huy?t p m?c tiu c nhn c?a qu v? c th? khc nhau ty thu?c v tnh tr?ng b?nh l, tu?i v cc y?u t? khc. Huy?t p ???c phn lo?i thnh b?n giai ?o?n. D?a trn ch? s? huy?t p c?a qu v?, chuyn gia ch?m [place] s?c kh?e c?a qu v? c th? s? d?ng nh?ng giai ?o?n sau ?y ?? xc ??nh lo?i ?i?u tr? qu v? c?n, n?u c. Huy?t p tm thu v huy?t p tm tr??ng ???c ?o theo ??n v? mm th?y ngn (mmHg). Bnh th??ng Huy?t p tm thu: d??i 120. Huy?t p tm tr??ng: d??i 80. Cao Huy?t p tm thu: 120-129. Huy?t p tm tr??ng: d??i 80. T?ng huy?t p giai ?o?n 1 Huy?t p tm thu: 130-139. Huy?t p tm tr??ng: 80-89. T?ng huy?t p giai ?o?n 2 Huy?t p tm thu: t? 140 tr? ln. Huy?t p tm tr??ng: t? 90 tr? ln. Tnh tr?ng ny c th? ?nh h??ng ??n ti nh? th? no? X? tr t?ng huy?t p c?a qu v? l r?t quan tr?ng. Theo th?i gian, t?ng huy?t p c th? gy t?n th??ng cc ??ng m?ch v lm gi?m l?u l??ng mu ??n cc b? ph?n c?a c? th?, g?m c? no, tim v th?n. T?ng huy?t p khng ???c ki?m sot ho?c ?i?u tr? c th? d?n t?i: Nh?i mu c? tim. ??t qu?SABRA M?ch mu b? y?u (phnh m?ch). Suy tim. T?n th??ng th?n. T?n th??ng m?t. Cc v?n ?? v? t?p trung v tr nh?. Sa st tr tu? do m?ch mu. Ti c th? th??c hi?n nh?ng hnh ??ng no ?? ki?m sot tnh tr?ng ny? C th? qu?n l t?ng huy?t p b?ng cch thay ??i l?i s?ng v c th? l b?ng cch dng thu?c. Chuyn gia ch?m [place] s?c kh?e c?a qu v? s? gip qu v? ln k? ho?ch ??a huy?t p v? gi?i h?n bnh th??ng. Qu v? c th? ???c gi?i thi?u ?? ???c t? v?n v? ch? ?? ?n lnh m?nh v ho?t ??ng th? ch?t. Dinh d??ng  ?n ch? ?? giu ch?t x? v kali v t mu?i (natri), t ???ng b? sung v t ch?t [place]. M?t k? ho?ch ?n ki?u m?u ???c g?i l ch? ?? ?n DASH. DASH l vi?t t?t c?a Dietary Approaches to Stop Hypertension (Ph??ng php ti?p c?n ch? ?? ?n u?ng ?? lm gi?m  huy?t p). ?n theo cch ny: ?n nhi?u tri cy v rau c? t??i. Vo m?i b?a ?n, c? g?ng dnh m?t n?a ??a cho tri cy v rau c?. ?n ng? c?c nguyn cm, ch?ng h?n nh? m ?ng lm t? b?t m nguyn cm, g?o l?t, ho?c bnh m t? b?t m nguyn cm. Cho ngu? c?c nguyn ca?m va?o kho?ng m?t ph?n t? ??a. ?n cc s?n ph?m s?a t [place]. Trnh nh?ng mi?ng th?t nhi?u m?, th?t ch? bi?n s?n ho?c th?t ??p mu?i v th?t gia c?m c da. Dnh kho?ng m?t ph?n t? ??a c?a qu v? cho cc protein n?c, ch?ng h?n nh? c, th?t g khng da, ??u, tr?ng v ??u ph?. Trnh nh?ng th?c ph?m ch? bi?n s?n ho?c lm s?n. Nh?ng th?c ph?m  ny th??ng c nhi?u natri, b? sung ???ng v ch?t [place] h?n. Gi?m l??ng dng natri hng ngy c?a qu v?. Nhi?u ng??i b? t?ng huy?t p c?n ?n d??i 1.500 mg natri m?i ngy. L?i s?ng  H?p tc v?i chuyn gia ch?m [place] s?c kh?e c?a qu v? ?? duy tr tr?ng l??ng c? th? c l?i cho s?c kh?e ho?c ?? gi?m cn. Hy h?i xem tr?ng l??ng no l l t??ng cho qu v?. Dnh t nh?t 30 pht t?p th? d?c c th? khi?n tim qu v? ??p nhanh h?n (t?p th? d?c nh?p ?i?u) h?u h?t cc ngy trong tu?n. Cc ho?t ??ng c th? bao g?m ?i b?, b?i, ho?c ??p xe. ??a vo bi t?p t?ng s?c m?nh c? b?p (bi t?p khng l?c), ch?ng h?n nh? nng t?, trong khun kh? thi quen luy?n t?p hng tu?n c?a qu v?. C? g?ng t?p nh?ng lo?i bi t?p ny trong vng 30 pht, t nh?t l 3 ngy m?i tu?n. Khng s? d?ng b?t k? s?n ph?m no c nicotine ho?c thu?c l. Nh?ng s?n ph?m ny bao g?m thu?c l d?ng ht, thu?c l d?ng nhai v d?ng c? ht thu?c, ch?ng h?n nh? thu?c l ?i?n t?. N?u qu v? c?n gip ?? ?? cai thu?c, hy h?i chuyn gia ch?m [place] s?c kh?e. Ki?m sot b?t k? tnh tr?ng ko di (m?n tnh) no m qu v? c, ch?ng h?n nh? cholesterol cao ho?c ti?u ???ng. Xc ??nh cc ngu?n gy c?ng th?ng v tm cch qu?n l c?ng th?ng. Vi?c ny c th? bao g?m thi?n ??nh, th? su ho?c dnh th?i gian cho cc ho?t ??ng vui v?. S? d?ng r??u Khng u?ng r??u n?u: Chuyn gia ch?m [place] s?c  kh?e khuyn qu v? khng u?ng r??u. Qu v? c thai, c th? c thai, ho?c ?ang c k? ho?ch c thai. N?u qu v? u?ng r??u: Gi?i h?n l??ng r??u qu v? u?ng ? m?c: 0-1 ly/ngy ??i v?i n? gi?i. 0-2 ly/ngy ??i v?i nam gi?i. Bi?t m?t ly c bao nhiu r??u. ? M?, m?t ly t??ng ???ng v?i m?t chai bia 12 ao-x? (355 mL), m?t ly r??u vang 5 ao-x? (148 mL), ho?c m?t ly r??u m?nh 1 ao-x? (44 mL). Thu?c Chuyn gia ch?m [place] s?c kh?e c th? k ??n thu?c n?u thay ??i l?i s?ng khng ?? ?? ??a huy?t p v? m?c c th? ki?m sot ???c v n?u: Huy?t p tm thu c?a qu v? t? 130 tr? ln. Huy?t p tm tr??ng c?a qu v? t? 80 tr? ln. Ch? s? d?ng thu?c theo h??ng d?n c?a chuyn gia ch?m [place] s?c kh?e cu?a quy? vi?. Lm theo ch? d?n m?t cch c?n th?n. Thu?c ?i?u tr? huy?t p ph?i ???c dng theo ch? d?n c?a chuyn gia ch?m [place] s?c kh?e. Thu?c c?ng s? khng c tc d?ng khi qu v? b? dng thu?c. Vi?c b? dng thu?c c?ng lm qu v? c nguy c? pht sinh v?n ??. Theo di Tr??c khi qu v? theo di huy?t p: Khng ht thu?c, u?ng ?? u?ng ch?a caffein, ho?c t?p th? d?c trong vng 30 pht tr??c khi ?o. ?i v? sinh v ?i ti?u (ti?u ti?n). Ng?i yn t?nh trong t nh?t 5 pht tr??c khi ?o. Theo di huy?t p c?a qu v? t?i nh theo h??ng d?n c?a chuyn gia ch?m [place] s?c kh?e. ?? lm ?i?u ny: Ng?i th?ng v c t?a l?ng. ?? hai bn chn b?ng ph?ng trn sn nh. Khng b?t cho chn. Ch?ng cnh tay trn m?t  b? m?t ph?ng, ch?ng h?n nh? bn. ??m b?o r?ng ph?n cnh tay trn c?a qu v? ngang b?ng tim. M?i l?n qu v? ?o, hy l?y hai ho?c ba ch? s? cch nhau m?t pht v ghi l?i cc k?t qu? ?. Qu v? c?ng c th? c?n c chuyn gia ch?m [place] s?c kho? ki?m tra huy?t p c?a qu v? th??ng xuyn. Thng tin chung [place] ??i v?i chuyn gia ch?m [place] s?c kh?e c?a qu v? v? ch? ?? ?n, thi qun luy?n t?p v cc y?u t? l?i s?ng khc c th? gp ph?n lm t?ng huy?t p. Cng v?i chuyn gia ch?m [place] s?c kh?e xem xt l?i t?t c? cc lo?i thu?c qu v? dng b?i v c th?  c tc d?ng ph? ho?c t??ng tc thu?c. Tun th? theo t?t c? cc l?n khm l?i. Chuyn gia ch?m [place] s?c kh?e c?a qu v? c th? gip qu v? t?o ra v ?i?u ch?nh k? ho?ch ki?m sot huy?t p cao. N?i tm thm thng tin National Heart, [employer], and [employer] Institute (Vi?n Tim, Ph?i v Mu Qu?c gia): [email or web address] American Heart Association (Hi?p h?i Tim m?ch [place]?): www.heart.org Hy lin l?c v?i chuyn gia ch?m [place] s?c kh?e n?u: Qu v? ngh? l qu v? c ph?n ?ng v?i cc lo?i thu?c ? dng. Qu v? b? ?au ??u l?p ?i l?p l?i (ti pht). Qu v? c?m th?y chng m?t. Qu v? b? s?ng ? c? chn. Qu v? c v?n ?? v? th? l?c. Yu c?u tr? gip ngay l?p t?c n?u: Qu v? b? ?au ??u r?t nhi?u ho?c l l?n. Qu v? b? y?u b?t th??ng ho?c t b, ho?c c?m th?y b? ng?t. Qu v? b? ?au r?t nhi?u ? ng?c ho?c b?ng. Qu v? nn nhi?u l?n. Qu v? b? kh th?. Nh?ng tri?u ch?ng ny c th? l tr??ng h?p c?p c?u. Yu c?u tr? gip ngay l?p t?c. Hy g?i 911. Khng ch? xem tri?u ch?ng c h?t khng. Khng t? li xe ??n b?nh vi?n. Tm t?t T?ng huy?t p l khi l?c b?m mu qua cc ??ng m?ch c?a qu v? qu m?nh. N?u tnh tr?ng ny khng ???c ki?m sot, n c th? khi?n qu v? c nguy c? b? cc bi?n ch?ng nghim tr?ng. Huy?t p m?c tiu c nhn c?a qu v? c th? khc nhau ty thu?c v tnh tr?ng b?nh l, tu?i v cc y?u t? khc. ??i v?i h?u h?t m?i ng??i, huy?t p bnh th??ng l d??i 120/80. Ki?m sot t?ng huy?t p b?ng cch thay ??i l?i s?ng, dng thu?c, ho?c c? hai. Thay ??i l?i s?ng ?? gip ki?m sot t?ng huy?t p bao g?m gi?m cn, ?n ch? ?? ?n c l?i cho s?c kh?e, t natri, t?p th? d?c nhi?u h?n, ng?ng ht thu?c v h?n ch? u?ng r??u. Thng tin ny khng nh?m m?c ?ch thay th? cho l?i khuyn m chuyn gia ch?m [place] s?c kh?e ni v?i qu v?. Hy b?o ??m qu v? ph?i th?o lu?n b?t k? v?n ?? g m qu v? c v?i chuyn gia ch?m [place] s?c kh?e c?a qu v?. Document Revised: 01/10/2021 Document Reviewed: 01/10/2021 Elsevier Patient Education   2024 [employer].     Emil Schaumann, MD [facility]

## 2024-02-25 NOTE — Assessment & Plan Note (Signed)
 Uncontrolled hypertension with elevated blood pressure readings at home and in the office Cardiovascular risks associated with uncontrolled hypertension discussed Diet and nutrition discussed Presently not taking medication Recommend lisinopril  20 mg daily Advised to monitor blood pressure readings at home daily for the next several weeks and contact the office if numbers persistently abnormal Blood work today Follow-up in 6 months

## 2024-02-26 LAB — URINE CULTURE: Result:: NO GROWTH

## 2024-04-19 ENCOUNTER — Encounter: Payer: Self-pay | Admitting: Gastroenterology

## 2024-05-25 ENCOUNTER — Ambulatory Visit: Admitting: Emergency Medicine
